# Patient Record
Sex: Female | Born: 1995 | Race: Black or African American | Hispanic: No | Marital: Single | Smoking: Former smoker
Health system: Southern US, Community
[De-identification: ages and names within clinical notes are randomized; demographics above are authoritative.]

## PROBLEM LIST (undated history)

## (undated) DIAGNOSIS — J45909 Unspecified asthma, uncomplicated: Secondary | ICD-10-CM

## (undated) DIAGNOSIS — G43909 Migraine, unspecified, not intractable, without status migrainosus: Secondary | ICD-10-CM

## (undated) DIAGNOSIS — I10 Essential (primary) hypertension: Secondary | ICD-10-CM

## (undated) DIAGNOSIS — M5430 Sciatica, unspecified side: Secondary | ICD-10-CM

## (undated) DIAGNOSIS — R011 Cardiac murmur, unspecified: Secondary | ICD-10-CM

## (undated) DIAGNOSIS — R55 Syncope and collapse: Secondary | ICD-10-CM

## (undated) DIAGNOSIS — R000 Tachycardia, unspecified: Secondary | ICD-10-CM

## (undated) DIAGNOSIS — E669 Obesity, unspecified: Secondary | ICD-10-CM

## (undated) HISTORY — PX: KELOID EXCISION: SHX1856

---
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
## 2014-03-25 DIAGNOSIS — Z309 Encounter for contraceptive management, unspecified: Secondary | ICD-10-CM | POA: Insufficient documentation

## 2014-04-14 ENCOUNTER — Observation Stay: Payer: Self-pay | Admitting: Specialist

## 2014-04-14 LAB — BASIC METABOLIC PANEL
Anion Gap: 11 (ref 7–16)
BUN: 10 mg/dL (ref 7–18)
CHLORIDE: 108 mmol/L — AB (ref 98–107)
Calcium, Total: 9.1 mg/dL (ref 9.0–10.7)
Co2: 21 mmol/L (ref 21–32)
Creatinine: 1.09 mg/dL (ref 0.60–1.30)
EGFR (African American): >60
Glucose: 102 mg/dL — ABNORMAL HIGH (ref 65–99)
OSMOLALITY: 279 (ref 275–301)
POTASSIUM: 3.9 mmol/L (ref 3.5–5.1)
SODIUM: 140 mmol/L (ref 136–145)

## 2014-04-14 LAB — URINALYSIS, COMPLETE
Bilirubin,UR: NEGATIVE
Blood: NEGATIVE
Glucose,UR: NEGATIVE mg/dL (ref 0–75)
KETONE: NEGATIVE
LEUKOCYTE ESTERASE: NEGATIVE
Nitrite: NEGATIVE
Ph: 6 (ref 4.5–8.0)
Protein: NEGATIVE
SPECIFIC GRAVITY: 1.024 (ref 1.003–1.030)

## 2014-04-14 LAB — CBC WITH DIFFERENTIAL/PLATELET
Basophil #: 0 10*3/uL (ref 0.0–0.1)
Basophil %: 0.7 %
Eosinophil #: 0.2 10*3/uL (ref 0.0–0.7)
Eosinophil %: 2.9 %
HCT: 40.4 % (ref 35.0–47.0)
HGB: 13 g/dL (ref 12.0–16.0)
Lymphocyte #: 2.3 10*3/uL (ref 1.0–3.6)
Lymphocyte %: 43.9 %
MCH: 26.5 pg (ref 26.0–34.0)
MCHC: 32.3 g/dL (ref 32.0–36.0)
MCV: 82 fL (ref 80–100)
MONO ABS: 0.5 x10 3/mm (ref 0.2–0.9)
Monocyte %: 9.4 %
NEUTROS ABS: 2.3 10*3/uL (ref 1.4–6.5)
NEUTROS PCT: 43.1 %
PLATELETS: 297 10*3/uL (ref 150–440)
RBC: 4.93 10*6/uL (ref 3.80–5.20)
RDW: 14.2 % (ref 11.5–14.5)
WBC: 5.3 10*3/uL (ref 3.6–11.0)

## 2014-04-14 LAB — DRUG SCREEN, URINE

## 2014-04-14 LAB — TROPONIN I: Troponin-I: <0.02 ng/mL

## 2014-04-14 LAB — CK-MB
CK-MB: 0.5 ng/mL (ref 0.5–3.6)
CK-MB: <0.5 ng/mL — ABNORMAL LOW (ref 0.5–3.6)

## 2014-04-15 ENCOUNTER — Ambulatory Visit: Payer: Self-pay | Admitting: Neurology

## 2014-04-15 LAB — CK-MB: CK-MB: <0.5 ng/mL — ABNORMAL LOW (ref 0.5–3.6)

## 2014-04-15 LAB — CBC WITH DIFFERENTIAL/PLATELET
Basophil #: 0 10*3/uL (ref 0.0–0.1)
Basophil %: 0.4 %
EOS ABS: 0.2 10*3/uL (ref 0.0–0.7)
EOS PCT: 3.5 %
HCT: 38.2 % (ref 35.0–47.0)
HGB: 12.7 g/dL (ref 12.0–16.0)
LYMPHS ABS: 3 10*3/uL (ref 1.0–3.6)
Lymphocyte %: 47.6 %
MCH: 26.9 pg (ref 26.0–34.0)
MCHC: 33.2 g/dL (ref 32.0–36.0)
MCV: 81 fL (ref 80–100)
MONO ABS: 0.5 x10 3/mm (ref 0.2–0.9)
MONOS PCT: 8.3 %
Neutrophil #: 2.5 10*3/uL (ref 1.4–6.5)
Neutrophil %: 40.2 %
PLATELETS: 260 10*3/uL (ref 150–440)
RBC: 4.71 10*6/uL (ref 3.80–5.20)
RDW: 14.6 % — AB (ref 11.5–14.5)
WBC: 6.3 10*3/uL (ref 3.6–11.0)

## 2014-04-15 LAB — BASIC METABOLIC PANEL
Anion Gap: 9 (ref 7–16)
BUN: 10 mg/dL (ref 7–18)
CO2: 23 mmol/L (ref 21–32)
Calcium, Total: 8.8 mg/dL — ABNORMAL LOW (ref 9.0–10.7)
Chloride: 108 mmol/L — ABNORMAL HIGH (ref 98–107)
Creatinine: 0.94 mg/dL (ref 0.60–1.30)
EGFR (Non-African Amer.): >60
Glucose: 90 mg/dL (ref 65–99)
OSMOLALITY: 278 (ref 275–301)
Potassium: 3.6 mmol/L (ref 3.5–5.1)
SODIUM: 140 mmol/L (ref 136–145)

## 2014-04-15 LAB — LIPID PANEL
Cholesterol: 235 mg/dL — ABNORMAL HIGH (ref 0–200)
HDL Cholesterol: 27 mg/dL — ABNORMAL LOW (ref 40–60)
LDL CHOLESTEROL, CALC: 181 mg/dL — AB (ref 0–100)
Triglycerides: 134 mg/dL (ref 0–135)
VLDL Cholesterol, Calc: 27 mg/dL (ref 5–40)

## 2014-04-15 LAB — TROPONIN I

## 2014-04-15 LAB — TSH: THYROID STIMULATING HORM: 3.64 u[IU]/mL

## 2014-04-17 ENCOUNTER — Inpatient Hospital Stay: Payer: Self-pay | Admitting: Internal Medicine

## 2014-04-17 LAB — COMPREHENSIVE METABOLIC PANEL
ALBUMIN: 3.8 g/dL (ref 3.8–5.6)
ALK PHOS: 76 U/L
ANION GAP: 6 — AB (ref 7–16)
BUN: 7 mg/dL (ref 7–18)
Bilirubin,Total: 0.3 mg/dL (ref 0.2–1.0)
CO2: 25 mmol/L (ref 21–32)
Calcium, Total: 9.7 mg/dL (ref 9.0–10.7)
Chloride: 104 mmol/L (ref 98–107)
Creatinine: 0.93 mg/dL (ref 0.60–1.30)
EGFR (African American): >60
Glucose: 93 mg/dL (ref 65–99)
Osmolality: 268 (ref 275–301)
Potassium: 4 mmol/L (ref 3.5–5.1)
SGOT(AST): 29 U/L — ABNORMAL HIGH (ref 0–26)
SGPT (ALT): 44 U/L
SODIUM: 135 mmol/L — AB (ref 136–145)
Total Protein: 7.5 g/dL (ref 6.4–8.6)

## 2014-04-17 LAB — URINALYSIS, COMPLETE
BILIRUBIN, UR: NEGATIVE
BLOOD: NEGATIVE
Glucose,UR: 50 mg/dL (ref 0–75)
Ketone: NEGATIVE
LEUKOCYTE ESTERASE: NEGATIVE
Nitrite: NEGATIVE
Ph: 7 (ref 4.5–8.0)
Protein: NEGATIVE
Specific Gravity: 1.013 (ref 1.003–1.030)

## 2014-04-17 LAB — CBC WITH DIFFERENTIAL/PLATELET
BASOS PCT: 0.5 %
Basophil #: 0 10*3/uL (ref 0.0–0.1)
EOS ABS: 0.3 10*3/uL (ref 0.0–0.7)
EOS PCT: 2.5 %
HCT: 40.6 % (ref 35.0–47.0)
HGB: 13.4 g/dL (ref 12.0–16.0)
LYMPHS PCT: 12 %
Lymphocyte #: 1.3 10*3/uL (ref 1.0–3.6)
MCH: 27 pg (ref 26.0–34.0)
MCHC: 33.1 g/dL (ref 32.0–36.0)
MCV: 82 fL (ref 80–100)
Monocyte #: 0.6 x10 3/mm (ref 0.2–0.9)
Monocyte %: 5.6 %
NEUTROS ABS: 8.5 10*3/uL — AB (ref 1.4–6.5)
Neutrophil %: 79.4 %
PLATELETS: 276 10*3/uL (ref 150–440)
RBC: 4.96 10*6/uL (ref 3.80–5.20)
RDW: 14.4 % (ref 11.5–14.5)
WBC: 10.7 10*3/uL (ref 3.6–11.0)

## 2014-04-18 LAB — CBC WITH DIFFERENTIAL/PLATELET
BASOS ABS: 0 10*3/uL (ref 0.0–0.1)
Basophil %: 0.4 %
EOS ABS: 0.4 10*3/uL (ref 0.0–0.7)
EOS PCT: 3 %
HCT: 36.6 % (ref 35.0–47.0)
HGB: 12.2 g/dL (ref 12.0–16.0)
Lymphocyte #: 2.3 10*3/uL (ref 1.0–3.6)
Lymphocyte %: 19.5 %
MCH: 27 pg (ref 26.0–34.0)
MCHC: 33.3 g/dL (ref 32.0–36.0)
MCV: 81 fL (ref 80–100)
Monocyte #: 0.9 x10 3/mm (ref 0.2–0.9)
Monocyte %: 7.3 %
NEUTROS PCT: 69.8 %
Neutrophil #: 8.2 10*3/uL — ABNORMAL HIGH (ref 1.4–6.5)
PLATELETS: 260 10*3/uL (ref 150–440)
RBC: 4.5 10*6/uL (ref 3.80–5.20)
RDW: 14.1 % (ref 11.5–14.5)
WBC: 11.8 10*3/uL — ABNORMAL HIGH (ref 3.6–11.0)

## 2014-04-18 LAB — BASIC METABOLIC PANEL
Anion Gap: 7 (ref 7–16)
BUN: 6 mg/dL — ABNORMAL LOW (ref 7–18)
CREATININE: 0.99 mg/dL (ref 0.60–1.30)
Calcium, Total: 8.3 mg/dL — ABNORMAL LOW (ref 9.0–10.7)
Chloride: 107 mmol/L (ref 98–107)
Co2: 25 mmol/L (ref 21–32)
EGFR (African American): >60
EGFR (Non-African Amer.): >60
Glucose: 106 mg/dL — ABNORMAL HIGH (ref 65–99)
Osmolality: 276 (ref 275–301)
Potassium: 3.6 mmol/L (ref 3.5–5.1)
Sodium: 139 mmol/L (ref 136–145)

## 2014-04-19 LAB — DRUG SCREEN, URINE

## 2014-04-20 DIAGNOSIS — R569 Unspecified convulsions: Secondary | ICD-10-CM | POA: Insufficient documentation

## 2014-04-25 ENCOUNTER — Emergency Department: Payer: Self-pay | Admitting: Emergency Medicine

## 2014-04-25 LAB — COMPREHENSIVE METABOLIC PANEL
ALBUMIN: 3.4 g/dL — AB (ref 3.8–5.6)
ALT: 42 U/L
ANION GAP: 6 — AB (ref 7–16)
AST: 28 U/L — AB (ref 0–26)
Alkaline Phosphatase: 66 U/L
BUN: 7 mg/dL (ref 7–18)
Bilirubin,Total: 0.2 mg/dL (ref 0.2–1.0)
CO2: 24 mmol/L (ref 21–32)
CREATININE: 0.95 mg/dL (ref 0.60–1.30)
Calcium, Total: 8.4 mg/dL — ABNORMAL LOW (ref 9.0–10.7)
Chloride: 111 mmol/L — ABNORMAL HIGH (ref 98–107)
EGFR (African American): >60
Glucose: 106 mg/dL — ABNORMAL HIGH (ref 65–99)
OSMOLALITY: 280 (ref 275–301)
Potassium: 3.6 mmol/L (ref 3.5–5.1)
SODIUM: 141 mmol/L (ref 136–145)
TOTAL PROTEIN: 7 g/dL (ref 6.4–8.6)

## 2014-04-25 LAB — CBC
HCT: 37.7 % (ref 35.0–47.0)
HGB: 12.1 g/dL (ref 12.0–16.0)
MCH: 26.1 pg (ref 26.0–34.0)
MCHC: 32.2 g/dL (ref 32.0–36.0)
MCV: 81 fL (ref 80–100)
Platelet: 346 10*3/uL (ref 150–440)
RBC: 4.64 10*6/uL (ref 3.80–5.20)
RDW: 13.9 % (ref 11.5–14.5)
WBC: 6.1 10*3/uL (ref 3.6–11.0)

## 2014-05-03 ENCOUNTER — Emergency Department: Payer: Self-pay | Admitting: Internal Medicine

## 2014-05-03 LAB — COMPREHENSIVE METABOLIC PANEL
ALBUMIN: 3.6 g/dL — AB (ref 3.8–5.6)
ALK PHOS: 63 U/L
ANION GAP: 7 (ref 7–16)
AST: 43 U/L — AB (ref 0–26)
BUN: 8 mg/dL (ref 7–18)
Bilirubin,Total: 0.3 mg/dL (ref 0.2–1.0)
CALCIUM: 8.5 mg/dL — AB (ref 9.0–10.7)
CO2: 25 mmol/L (ref 21–32)
CREATININE: 0.94 mg/dL (ref 0.60–1.30)
Chloride: 107 mmol/L (ref 98–107)
EGFR (African American): >60
EGFR (Non-African Amer.): >60
Glucose: 90 mg/dL (ref 65–99)
OSMOLALITY: 275 (ref 275–301)
Potassium: 3.7 mmol/L (ref 3.5–5.1)
SGPT (ALT): 53 U/L
Sodium: 139 mmol/L (ref 136–145)
Total Protein: 7.2 g/dL (ref 6.4–8.6)

## 2014-05-03 LAB — CK TOTAL AND CKMB (NOT AT ARMC): CK, Total: 148 U/L

## 2014-05-03 LAB — TROPONIN I: Troponin-I: <0.02 ng/mL

## 2014-05-03 LAB — CBC
HCT: 38.8 % (ref 35.0–47.0)
HGB: 12.7 g/dL (ref 12.0–16.0)
MCH: 26.7 pg (ref 26.0–34.0)
MCHC: 32.8 g/dL (ref 32.0–36.0)
MCV: 81 fL (ref 80–100)
Platelet: 304 10*3/uL (ref 150–440)
RBC: 4.78 10*6/uL (ref 3.80–5.20)
RDW: 14.1 % (ref 11.5–14.5)
WBC: 5.4 10*3/uL (ref 3.6–11.0)

## 2014-07-19 ENCOUNTER — Emergency Department: Payer: Self-pay | Admitting: Emergency Medicine

## 2014-07-19 LAB — URINALYSIS, COMPLETE
BACTERIA: NONE SEEN
BILIRUBIN, UR: NEGATIVE
Blood: NEGATIVE
GLUCOSE, UR: NEGATIVE mg/dL (ref 0–75)
Ketone: NEGATIVE
Leukocyte Esterase: NEGATIVE
NITRITE: NEGATIVE
PH: 7 (ref 4.5–8.0)
Protein: NEGATIVE
RBC,UR: <1 /HPF (ref 0–5)
Specific Gravity: 1.014 (ref 1.003–1.030)
Squamous Epithelial: 7

## 2014-07-19 LAB — CK TOTAL AND CKMB (NOT AT ARMC)
CK, Total: 141 U/L (ref 26–192)
CK-MB: 0.7 ng/mL (ref 0.5–3.6)

## 2014-07-19 LAB — TROPONIN I: Troponin-I: <0.02 ng/mL

## 2014-07-19 LAB — COMPREHENSIVE METABOLIC PANEL
ALK PHOS: 82 U/L
ALT: 30 U/L
Albumin: 3.3 g/dL — ABNORMAL LOW (ref 3.8–5.6)
Anion Gap: 7 (ref 7–16)
BUN: 7 mg/dL (ref 7–18)
Bilirubin,Total: 0.2 mg/dL (ref 0.2–1.0)
CHLORIDE: 108 mmol/L — AB (ref 98–107)
CO2: 27 mmol/L (ref 21–32)
Calcium, Total: 8.1 mg/dL — ABNORMAL LOW (ref 9.0–10.7)
Creatinine: 0.91 mg/dL (ref 0.60–1.30)
GLUCOSE: 108 mg/dL — AB (ref 65–99)
Osmolality: 282 (ref 275–301)
Potassium: 4 mmol/L (ref 3.5–5.1)
SGOT(AST): 32 U/L — ABNORMAL HIGH (ref 0–26)
Sodium: 142 mmol/L (ref 136–145)
Total Protein: 6.8 g/dL (ref 6.4–8.6)

## 2014-07-19 LAB — CBC
HCT: 38.1 % (ref 35.0–47.0)
HGB: 12.2 g/dL (ref 12.0–16.0)
MCH: 26.5 pg (ref 26.0–34.0)
MCHC: 32.1 g/dL (ref 32.0–36.0)
MCV: 83 fL (ref 80–100)
PLATELETS: 293 10*3/uL (ref 150–440)
RBC: 4.62 10*6/uL (ref 3.80–5.20)
RDW: 14 % (ref 11.5–14.5)
WBC: 7.5 10*3/uL (ref 3.6–11.0)

## 2014-07-20 ENCOUNTER — Emergency Department: Payer: Self-pay | Admitting: Student

## 2014-07-20 LAB — CK TOTAL AND CKMB (NOT AT ARMC)
CK, TOTAL: 137 U/L (ref 26–192)
CK-MB: 0.6 ng/mL (ref 0.5–3.6)

## 2014-07-20 LAB — DRUG SCREEN, URINE
Amphetamines, Ur Screen: NEGATIVE (ref ?–1000)
BENZODIAZEPINE, UR SCRN: NEGATIVE (ref ?–200)
Barbiturates, Ur Screen: NEGATIVE (ref ?–200)
Cannabinoid 50 Ng, Ur ~~LOC~~: NEGATIVE (ref ?–50)
Cocaine Metabolite,Ur ~~LOC~~: NEGATIVE (ref ?–300)
MDMA (ECSTASY) UR SCREEN: NEGATIVE (ref ?–500)
METHADONE, UR SCREEN: NEGATIVE (ref ?–300)
Opiate, Ur Screen: NEGATIVE (ref ?–300)
Phencyclidine (PCP) Ur S: NEGATIVE (ref ?–25)
Tricyclic, Ur Screen: NEGATIVE (ref ?–1000)

## 2014-07-20 LAB — CBC
HCT: 38.7 % (ref 35.0–47.0)
HGB: 12.3 g/dL (ref 12.0–16.0)
MCH: 26 pg (ref 26.0–34.0)
MCHC: 31.7 g/dL — ABNORMAL LOW (ref 32.0–36.0)
MCV: 82 fL (ref 80–100)
PLATELETS: 277 10*3/uL (ref 150–440)
RBC: 4.72 10*6/uL (ref 3.80–5.20)
RDW: 13.9 % (ref 11.5–14.5)
WBC: 6.6 10*3/uL (ref 3.6–11.0)

## 2014-07-20 LAB — COMPREHENSIVE METABOLIC PANEL
AST: 24 U/L (ref 0–26)
Albumin: 3.4 g/dL — ABNORMAL LOW (ref 3.8–5.6)
Alkaline Phosphatase: 70 U/L
Anion Gap: 8 (ref 7–16)
BILIRUBIN TOTAL: 0.3 mg/dL (ref 0.2–1.0)
BUN: 8 mg/dL (ref 7–18)
CO2: 25 mmol/L (ref 21–32)
CREATININE: 1.04 mg/dL (ref 0.60–1.30)
Calcium, Total: 8.7 mg/dL — ABNORMAL LOW (ref 9.0–10.7)
Chloride: 108 mmol/L — ABNORMAL HIGH (ref 98–107)
EGFR (African American): >60
EGFR (Non-African Amer.): >60
GLUCOSE: 93 mg/dL (ref 65–99)
OSMOLALITY: 279 (ref 275–301)
Potassium: 3.6 mmol/L (ref 3.5–5.1)
SGPT (ALT): 28 U/L
Sodium: 141 mmol/L (ref 136–145)
Total Protein: 7 g/dL (ref 6.4–8.6)

## 2014-07-20 LAB — URINALYSIS, COMPLETE
BACTERIA: NONE SEEN
BILIRUBIN, UR: NEGATIVE
Blood: NEGATIVE
GLUCOSE, UR: NEGATIVE mg/dL (ref 0–75)
Hyaline Cast: 3
Ketone: NEGATIVE
Leukocyte Esterase: NEGATIVE
Nitrite: NEGATIVE
PROTEIN: NEGATIVE
Ph: 7 (ref 4.5–8.0)
RBC,UR: <1 /HPF (ref 0–5)
SPECIFIC GRAVITY: 1.015 (ref 1.003–1.030)
Squamous Epithelial: 14

## 2014-07-20 LAB — HCG, QUANTITATIVE, PREGNANCY

## 2014-07-20 LAB — TROPONIN I

## 2014-08-24 DIAGNOSIS — R079 Chest pain, unspecified: Secondary | ICD-10-CM | POA: Insufficient documentation

## 2014-08-24 DIAGNOSIS — F445 Conversion disorder with seizures or convulsions: Secondary | ICD-10-CM | POA: Insufficient documentation

## 2014-08-24 DIAGNOSIS — R55 Syncope and collapse: Secondary | ICD-10-CM | POA: Insufficient documentation

## 2014-08-24 DIAGNOSIS — G43019 Migraine without aura, intractable, without status migrainosus: Secondary | ICD-10-CM | POA: Insufficient documentation

## 2014-08-24 HISTORY — DX: Conversion disorder with seizures or convulsions: F44.5

## 2014-11-04 ENCOUNTER — Emergency Department: Admit: 2014-11-04 | Disposition: A | Discharge status: Home / Self Care | Payer: Self-pay | Admitting: Student

## 2014-11-04 LAB — DRUG SCREEN, URINE
Amphetamines, Ur Screen: NEGATIVE
Barbiturates, Ur Screen: NEGATIVE
Benzodiazepine, Ur Scrn: NEGATIVE
CANNABINOID 50 NG, UR ~~LOC~~: NEGATIVE
COCAINE METABOLITE, UR ~~LOC~~: NEGATIVE
MDMA (Ecstasy)Ur Screen: NEGATIVE
Methadone, Ur Screen: NEGATIVE
OPIATE, UR SCREEN: NEGATIVE
PHENCYCLIDINE (PCP) UR S: NEGATIVE
Tricyclic, Ur Screen: NEGATIVE

## 2014-11-04 LAB — URINALYSIS, COMPLETE
BILIRUBIN, UR: NEGATIVE
Blood: NEGATIVE
Glucose,UR: NEGATIVE mg/dL (ref 0–75)
Ketone: NEGATIVE
Nitrite: NEGATIVE
Ph: 6 (ref 4.5–8.0)
Protein: NEGATIVE
Specific Gravity: 1.019 (ref 1.003–1.030)

## 2014-11-04 LAB — COMPREHENSIVE METABOLIC PANEL
ANION GAP: 7 (ref 7–16)
AST: 19 U/L
Albumin: 4 g/dL
Alkaline Phosphatase: 51 U/L
BUN: 8 mg/dL
Bilirubin,Total: <0.1 mg/dL — ABNORMAL LOW
Calcium, Total: 9 mg/dL
Chloride: 114 mmol/L — ABNORMAL HIGH
Co2: 22 mmol/L
Creatinine: 1.02 mg/dL — ABNORMAL HIGH
EGFR (African American): >60
EGFR (Non-African Amer.): >60
Glucose: 87 mg/dL
Potassium: 3.6 mmol/L
SGPT (ALT): 19 U/L
Sodium: 143 mmol/L
TOTAL PROTEIN: 7 g/dL

## 2014-11-04 LAB — CBC
HCT: 34.8 % — ABNORMAL LOW (ref 35.0–47.0)
HGB: 11.4 g/dL — AB (ref 12.0–16.0)
MCH: 25.9 pg — ABNORMAL LOW (ref 26.0–34.0)
MCHC: 32.8 g/dL (ref 32.0–36.0)
MCV: 79 fL — ABNORMAL LOW (ref 80–100)
Platelet: 260 10*3/uL (ref 150–440)
RBC: 4.41 10*6/uL (ref 3.80–5.20)
RDW: 15.3 % — ABNORMAL HIGH (ref 11.5–14.5)
WBC: 5.7 10*3/uL (ref 3.6–11.0)

## 2014-11-04 LAB — ETHANOL: Ethanol: <5 mg/dL

## 2014-11-04 LAB — HCG, QUANTITATIVE, PREGNANCY: Beta Hcg, Quant.: <1 m[IU]/mL

## 2014-11-21 NOTE — Discharge Summary (Signed)
PATIENT NAME:  Joann ApleyMOORE, [name] MR#:  [id number] DATE OF BIRTH:  03-Dec-1995  DATE OF ADMISSION:  04/17/2014 DATE OF DISCHARGE:  04/19/2014, [facility].   FINAL DIAGNOSES:  1.  Recurrent syncope, possible pseudoseizure versus seizure.  2.  Otitis media.  3.  Depression.   CURRENT MEDICATIONS INCLUDE:  Tylenol 650 mg orally every 4 hours as needed for pain, Colace 100 mg twice a day, Flonase nasal spray 2 sprays both nostrils daily, doxycycline 100 mg q. 12 hours, Keppra 1000 mg twice a day, Prozac 20 mg daily.   HOSPITAL COURSE: The patient was admitted 04/17/2014 with recurrent syncope, multiple syncopal episodes. She was recently in the hospital September 15 and discharged September 16, and now back September 18 with these syncopal episodes on a daily basis. Sometimes she passed out for longer periods of time, is confused afterwards and weak afterwards. No loss of bowel or bladder function, had trouble recognizing people. She was admitted to the hospital with seizure versus syncopal episode. She was seen in consultation by cardiology, Dr. Adrian BlackwaterShaukat Khan; by psychiatry, Dr. Margarita RanaSurya Challa; by neurology, Dr. Pauletta BrownsYuriy Zeylikman.    LABORATORY AND RADIOLOGICAL DATA DURING THIS AND THE LAST HOSPITAL COURSE:    LDL 181, HDL 27, triglycerides 134, TSH 3.64, prolactin 13.   The patient had a negative urine toxicology on September 15.   CT scan of the head negative.   Chest x-ray showed slight prominence of the main pulmonary artery, likely rotation.   Echocardiogram showed EF 55-60%, impaired relaxation, left atrium normal size and structure, moderately increased left ventricular posterior wall thickness. Small pericardial effusion, elevated mean left atrial pressure.   MRI of the brain with and without contrast had extensive artifact from dental braces. Examination of the brain appears normal.   ASSESSMENT AND PLAN:  1.  For the patient's recurrent syncope, this could be possible seizure. Telemetry  monitoring shows just sinus tachycardia episodes. Last hospitalization and this hospitalization all testing done here has been negative. Neurology and cardiology did see the patient. Neurology is not convinced that this is seizure but started Keppra and recommended video EEG monitoring, so I did speak with Dr. Lorenso CourierPowers [facility] to [facility] transfer for video EEG monitoring to figure out whether this is seizure or pseudoseizure. They did recommend a Holter and event monitor as an outpatient, which Dr. Welton FlakesKhan would be able to set up on Monday if out of the hospital by Monday. So far I do not have a reason for the patient's recurrent syncope.  2.  Otitis media, doxycycline p.o.  3.  Depression on Prozac.  4.  Obesity. BMI of 45.3. Weight loss is needed.   Time spent on patient care today: 60 minutes.    ____________________________ Herschell Dimesichard J. Renae GlossWieting, MD rjw:lt D: 04/19/2014 09:37:25 ET T: 04/19/2014 10:34:08 ET JOB#: [id number]  cc: Herschell Dimesichard J. Renae GlossWieting, MD, <Dictator> Salley ScarletICHARD J Brianny Soulliere MD ELECTRONICALLY SIGNED 04/23/2014 14:01

## 2014-11-21 NOTE — Consult Note (Signed)
PATIENT NAME:  Joann Miller, [name] MR#:  [id number] DATE OF BIRTH:  March 04, 1996  DATE OF CONSULTATION:  04/18/2014  CONSULTING PHYSICIAN:  Pauletta BrownsYuriy Derrion Tritz, MD  REASON FOR CONSULTATION: Syncope, seizure activity.   HISTORY OF PRESENT ILLNESS: This is a well-known patient to me, last seen a couple of days ago. A 19 year old female with multiple syncopal episodes in the past 10 days with increasing frequency with another syncopal/questionable seizure episode. This time, she had questionable loss of consciousness and confusion for about 30 minutes, now back to baseline. Information obtained from patient, patient's family who is currently at bedside.   PAST MEDICAL HISTORY: Syncope versus questionable seizure activity, hypertension, morbid obesity, asthma, cardiac murmur.   SOCIAL HISTORY: The patient lives at home with her grandmother.   FAMILY HISTORY: Noncontributory.   REVIEW OF SYSTEMS: No acute abnormalities found except history of syncope, history of depression, history of anxiety.   PHYSICAL EXAMINATION: VITAL SIGNS: Include a temperature of 98.8, pulse 108, respirations 19, blood pressure 138/74. Orthostatics are negative.  NEUROLOGIC: Awake, alert and oriented. Extraocular movements intact. No facial symmetry. Tongue is midline. Motor strength intact bilaterally, 5/5. Sensation intact to light touch and temperature. Coordination intact. Reflexes diminished. Gait not assessed.   IMPRESSION: A 19 year old female with multiple status seizure episodes, all differ in nature and duration. With the last one, there was a questionable loss of consciousness and the patient was confused for up to 30 minutes as per family. No tongue biting. No urinary incontinence. Currently back to baseline.   PLAN: Agree with obtaining MRI. The patient was loaded with Keppra 1500 mg. We will start on Keppra a gram every 12, as I cannot definitely rule out seizure activity at this point. We will follow up MRI. If the  patient is at baseline for the next day or so, would probably discharge her. Appreciate cardiology input. Agree with possibility of Holter outpatient monitor. If this occurs again, I think the patient needs admission to a bigger center where she needs video EEG monitoring for probably 2 or 3 days, which was explained to the patient's family. [facility]. Continue with Keppra a gram every 12 as an outpatient until further EEGs have been done. Meanwhile, the patient should follow up with [facility]. This case was discussed with the family, the grandparents and the father at bedside.   Thank you. It was a pleasure seeing this patient.     ____________________________ Pauletta BrownsYuriy Mako Pelfrey, MD yz:TT D: 04/18/2014 15:34:19 ET T: 04/18/2014 15:46:31 ET JOB#: [id number]  cc: Pauletta BrownsYuriy Wymon Swaney, MD, <Dictator> Pauletta BrownsYURIY Kamira Mellette MD ELECTRONICALLY SIGNED 05/04/2014 14:25

## 2014-11-21 NOTE — Consult Note (Signed)
PATIENT NAME:  Joann Miller, Joann Miller MR#:  [id number] DATE OF BIRTH:  08/23/1995  DATE OF CONSULTATION:  04/18/2014  REFERRING PHYSICIAN:   CONSULTING PHYSICIAN:  Laurier NancyShaukat A. Manvir Thorson, MD  INDICATION FOR CONSULTATION: Syncope repeatedly.   HISTORY OF PRESENT ILLNESS: This is a 19 year old African American female with a past medical history of hypertension, morbid obesity, asthma, who came into the hospital because of repeated episodes of passing out about 7 to 10 times in the last 10 days. She has been seen by neurology in the past and was recommended a Holter monitor by the neurologist as an outpatient. She had an echocardiogram done here while in the hospital also, which was unremarkable. She had some occasional atypical chest pain described as sharp pain in the chest intermittently, thus I was asked to evaluate the patient.   PAST MEDICAL HISTORY: As mentioned above.   ALLERGIES: PENICILLIN.   MEDICATIONS: Loratadine 10 mg once a day, fluticasone 5 mcg inhalation spray one spray per nostril once a day, fluoxetine 20 mg once a day.   SOCIAL HISTORY: She is an ex-smoker. No history of EtOH abuse or drug use.   FAMILY HISTORY: Positive for bipolar disorder.   PHYSICAL EXAMINATION: GENERAL: She is alert and oriented x 3, in no acute distress.  VITALS: Stable.  HEENT: No JVD.  LUNGS: Clear.  HEART: Regular rate and rhythm. Normal S1, S2. No audible murmur.  ABDOMEN: Soft, nontender, positive bowel sounds.  EXTREMITIES: No pedal edema.  NEUROLOGIC: The patient appears to be intact.   DIAGNOSTIC STUDIES: Her EKG shows normal sinus rhythm, sinus tachycardia, poor R wave progression, nonspecific ST-T changes.   LABORATORY DATA: BUN and creatinine when she came in was 10/1.09. Troponins x 2 were negative. Toxicology screen was negative. Her white count was 6.3, hemoglobin 12.7, and platelet count 260,000. She had a CT that was negative and echocardiogram was done which showed LVEF of 55% to 60%, mild  diastolic dysfunction, normal chamber size. There was trace mitral regurgitation and trace tricuspid regurgitation. Pulmonary artery pressures were normal.   ASSESSMENT AND PLAN: The patient had repeated episodes of syncope. EKG just showed sinus tachycardia initially when she first came. Right now she is not tachycardic. Cardiac enzymes were negative. Her chest pain appears to be atypical. We do not feel that she has any cardiac event going on. Her ejection fraction is normal, essentially just trace mitral regurgitation, trace tricuspid regurgitation. She ruled out for myocardial infarction. I advised getting outpatient Holter monitor. She was supposed to see us this Monday in the office. I advised discharging the patient and continue to follow up Monday in the office, and will put her on an event monitor or Holter monitor. Thank you very much for referral.     ____________________________ Laurier NancyShaukat A. Nuriyah Hanline, MD sak:at D: 04/18/2014 13:55:37 ET T: 04/18/2014 14:05:03 ET JOB#: [id number]  cc: Laurier NancyShaukat A. Melessa Cowell, MD, <Dictator> Laurier NancySHAUKAT A Iqra Rotundo MD ELECTRONICALLY SIGNED 05/07/2014 9:55

## 2014-11-21 NOTE — H&P (Signed)
PATIENT NAME:  Joann Miller, Joann Miller MR#:  [id number] DATE OF BIRTH:  1996-01-11  DATE OF ADMISSION:  04/14/2014  PRIMARY CARE PHYSICIAN: Nonlocal.   REFERRING PHYSICIAN: Toney RakesEryka Gayle, MD   CHIEF COMPLAINT: Syncope.   HISTORY OF PRESENT ILLNESS: The patient is a 19 year old African-American female with history of asthma and cardiac murmur, is presenting to the ED with a chief complaint of 6 episode of syncope for the past 10 days. Her mom works as a Engineer, civil (consulting)nurse and she is concerned about the patient's frequent syncopal episodes. The patient has chronic history of cardiac murmur, was evaluated by cardiology in the past and her entire workup was negative at that time. The patient has history of migraine headaches and she takes ibuprofen as needed basis. She is reporting that lately ibuprofen is not helping her much and her headaches are getting worse. She typically describes that each episode of syncope lasts for a few minutes and then soon after, she  spontaneously recovers, she gets headache and blurry vision. Before, syncope, she does not feel lightheaded, does not have any aura. Denies any chest pain or shortness of breath. Denies any palpitations. Denies any history of anxiety. Denies any recent stressors.  Mom and grandma were at bedside. No other complaints.   PAST MEDICAL HISTORY: Cardiac murmur, chronic asthma, migraine headaches, obesity.   PAST SURGICAL HISTORY: Keloid removal.   ALLERGIES: PENICILLIN.    PSYCHOSOCIAL HISTORY: Lives at home with grandmom, occasional smoking. No alcohol or illicit drug usage.   FAMILY HISTORY: Mother has history of bipolar disorder.   HOME MEDICATIONS: Ventolin 2 puff inhalation q. 4 times a day as needed basis,  multivitamin once daily, MiraLax orally once daily as needed for constipation, loratadine 10 mg p.o. once daily, hydrochlorothiazide 12.5 mg p.o. once daily, Advair 500 mcg 1 puff inhalation 2 times a day.   REVIEW OF SYSTEMS:   CONSTITUTIONAL: Denies any  fever, fatigue, weakness.  EYES: Complaining of blurry vision associated with a headache after syncopal episode.  ENT: Denies epistaxis, discharge, snoring, postnasal drip.  RESPIRATORY: Denies cough, COPD, has chronic history of asthma.  CARDIOVASCULAR: No chest pain or palpitations, but chronic history of cardiac murmur.  GASTROINTESTINAL: Denies nausea, vomiting, diarrhea, abdominal pain.  GENITOURINARY: No dysuria or hematuria.  GYNECOLOGIC AND BREASTS: Denies breast mass or vaginal discharge.  ENDOCRINE: Denies polyuria, nocturia, thyroid abnormalities.  The patient has obesity.  HEMATOLOGIC AND LYMPHATICS: No anemia, easy bruising or bleeding.  INTEGUMENTARY: No acne, rash, lesions.  MUSCULOSKELETAL: No joint pain in the neck and back. Denies gout.  NEUROLOGIC: Denies vertigo, ataxia, dementia; frequent syncopal episodes x6 in the past 10 days.   PSYCHIATRIC: Normal mood and effect  PHYSICAL EXAMINATION: VITAL SIGNS: Temperature 99.5, pulse 81, respirations 16 to 18, blood pressure 136/66, pulse oximetry 98% on room air.  GENERAL APPEARANCE: Not in any acute distress. Morbidly obese.  HEENT: Normocephalic, atraumatic. Pupils are equally reacting to light and accommodation. No scleral icterus. No conjunctival injection. No sinus tenderness. No postnasal drip. Moist mucous membranes.  NECK: Supple. No JVD. No thyromegaly. Range of motion is intact.  LUNGS: Clear to auscultation bilaterally. No accessory muscle use and no anterior chest wall tenderness on palpation.  CARDIAC: S1 and S2 normal. Regular rate and rhythm. Positive cardiac murmur, 3/6.   GASTROINTESTINAL: Soft. Bowel sounds are positive in all 4 quadrants, obese, nontender, nondistended. No hepatosplenomegaly. No masses felt.  NEUROLOGIC: Awake, alert, and oriented x3. Cranial nerves II through XII are grossly intact. Motor  and sensory are intact. Reflexes  2+.  EXTREMITIES: No edema. No cyanosis, no cerebellar signs.    IMAGING: CT head: No significant abnormalities. Chest x-ray, portable, slight prominence of the main pulmonary artery.  May partially be due to the right-sided rotation during the imaging. A 12-lead EKG: Normal sinus rhythm with 1 PVC.   LABORATORY DATA: Glucose is 102. Accu-Chek 105. The rest of the Chem-8 is normal. Troponin less than 0.02. CBC normal.  Urinalysis: Nitrites, leukocyte esterase is negative, ketones are negative. Blood is negative.   ASSESSMENT AND PLAN: A 19 year old, African-American, obese female presenting to the Emergency Department with the chief complaint of 6 episodes of syncope in the past 10 days.  Will be admitted with the following assessment and plan.  1. 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 Differential diagnosis can be cardiac arrhythmia, vasovagal, orthostatic. We will admit her to telemetry. Cycle cardiac biomarkers. We will obtain neurologic checks. We will obtain 2-D echocardiogram for wall motion abnormalities and ejection fraction. Neurology consultation is placed to Dr. Pauletta Browns. I will provide her Toradol as needed basis for pain management. The patient being obese, blood pressure being elevated, verapamil might be a good prophylactic medication for her migraine headaches. We will try to avoid triptans as that might prolong the other symptoms.   2. History of cardiac murmur. We will obtain echocardiogram.  3. History of asthma, currently stable. Continue her home medication, Advair, and we will provide Proventil as needed basis.  4. Obesity. The patient will be benefited with lifestyle modification. Currently, we will put her on low-fat and low-salt diet.  5. History of migraine headaches.  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  6. We will provide gastrointestinal and deep vein thrombosis prophylaxis.  7. She is full code.  Mom is the medical power of  attorney.  8. Nicotine dependence. The patient was provided with counseling for approximately 5 minutes.  We will provide a nicotine patch.   Plan of care was discussed in detail with the patient and her mom at bedside. They all verbalized understanding of the plan.   TOTAL TIME SPENT: Is 45 minutes. For nicotine cessation counseling, another 5 minutes was spent.     ____________________________ Ramonita Lab, MD ag:lr D: 04/14/2014 19:31:00 ET T: 04/14/2014 19:54:32 ET JOB#: [id number]  cc: Ramonita Lab, MD, <Dictator> Ramonita Lab MD ELECTRONICALLY SIGNED 04/21/2014 13:30

## 2014-11-21 NOTE — H&P (Signed)
PATIENT NAME:  Joann Miller, [name] MR#:  [id number] DATE OF BIRTH:  [birth year]  DATE OF ADMISSION:  04/17/2014  REFERRING EMERGENCY ROOM PHYSICIAN: Lurena Joinerebecca L. Lord, MD PRIMARY CARE PHYSICIAN: None.   CHIEF COMPLAINT: Multiple syncopal versus seizure episodes.   HISTORY OF PRESENT ILLNESS: This 19 year old female presents after a prolonged seizure/syncopal episode at home this morning. She was actually admitted on September 15 and discharged on September 16 for evaluation of the same. At that time, CT of the head was negative. Telemetry did not show any arrhythmia. A 2-D echocardiogram was normal. She was seen by neurology, Dr. Loretha BrasilZeylikman, who did not think that the patient had neurologic evidence of syncope. She was discharged with plan for Holter monitor and outpatient follow-up. The patient presents again today due to an episode this morning that occurred while she was seated at breakfast table. She was sitting down speaking her grandmother and said that she needed to use the restroom. Her grandmother told her to get up, but the patient responded that she was unable to stand. The grandmother got up to try to help her, but the patient slumped forward and her face went down onto the table. She then tried to stand and fell backwards onto the wall then onto the floor slowly and was unable to get up. The patient was unconscious for up to 30 minutes. She did not respond to stimuli when EMS presented. Even after the episode, she was confused and did not recognize several friends that were visiting. There was no tonic-clonic activity, no focal numbness or weakness, no slurred speech. The patient does complain of a headache before, during and after these episodes. The history is provided by the mother and grandmother as the patient is a poor historian.   PAST MEDICAL HISTORY:  1. Syncope versus seizure episodes starting around 04/14/2014.  2. Hypertension.  3. Morbid obesity.  4. Asthma.  5. Cardiac murmur.    PAST SURGICAL HISTORY: Keloid removal.   ALLERGIES: PENICILLIN CAUSES A RASH.   HOME MEDICATIONS:  1. Loratadine 10 mg 1 tablet daily.  2. Fluticasone 50 mcg inhalation nasal spray 1 spray each nostril once a day.  3. Fluoxetine 20 mg 1 tablet once a day   SOCIAL HISTORY: The patient lives at home with her grandmother. Her mother is present and participating in caregiving. She endorses occasional smoking. She does not drink alcohol or use illicit substances. She is not currently employed and is supposed to start classes in October. She had an Implanon removed several months ago. Not currently on any hormonal birth control.   FAMILY HISTORY: Mother has a history of bipolar disorder.   REVIEW OF SYSTEMS: The patient is unable to perform the review of systems due to altered mental status at the time of interview. The family denies any recent fevers, chills, nausea, vomiting. They do endorse decreased appetite. No cough, runny nose, shortness of breath. No urinary frequency or dysuria.   PHYSICAL EXAMINATION: VITAL SIGNS: Temperature 98.6, pulse 111, respirations 16, blood pressure 133/75, oxygenation 100% on room air.  GENERAL: No acute distress, lying comfortably in the examination bed.  HEENT: Pupils are equal, round, and reactive, conjunctivae are clear. Oral mucous membranes are pink and moist, braces are in place. There is no posterior pharyngeal exudate or erythema.  No cervical lymphadenopathy.  NECK: Obese. Trachea is midline. Thyroid is not palpable.  RESPIRATORY: On anterior examination, lungs are clear to auscultation bilaterally with good air movement. There is no coughing, wheezing or  shortness of breath.  CARDIOVASCULAR: Tachycardic, regular, no murmurs, rubs, or gallops, no peripheral edema. Peripheral pulses are 2+.  ABDOMEN: Soft, nontender, bowel sounds are present. There is no guarding, no rebound, no hepatosplenomegaly, no mass.  EXTREMITIES: No tender or swollen joints.  Range of motion of all joints is normal. She does not participate in provoked strength testing.  NEUROLOGIC: Cranial nerves II through XII are grossly intact. She moves all 4 extremities. She does not participate in the rest of the neurologic examination.  PSYCHIATRIC: The patient seems depressed. She is reluctant to talk. She seems somewhat confused and speaks in very simplistic terms, which her family reports is not normal for her.   LABORATORY DATA: CBC and CMP are pending at this time. Blood glucose is 91.   IMAGING: There is no imaging at this time.   ASSESSMENT AND PLAN: 1. Seizure versus syncopal activity: As noted above, she has recently been admitted for evaluation of the same and at the time testing including a 2-D echocardiogram, telemetry, EKG, CT of the head were all normal. She was seen by neurology at that time and no further testing was recommended from a neurologic standpoint. Plan was for a Holter monitor. At this point, we will admit to telemetry for monitoring for possible arrhythmia. Will order an EEG, though unfortunately it looks like this may not possible until Monday. This could be done as outpatient. Will consult neurology. Unfortunately, due to the patient's current inability to walk, she is not fit for discharge. I will not re-order 2-D echocardiogram or CT scan, as there are no focal deficits and recent testing was normal. Will hold off on MRI for now, again, as there are no focal deficits. Will defer to neurology for further testing. I do feel that she has some abnormal behavior, which may be related to stress and have ordered a psychiatric consultation as well.  2. Positive orthostatics: This may account for some of her symptoms. During our interview, I asked her to stand and in doing so, she became abruptly tachycardic, fell backwards and had a recurrence of her episode. Will start IV fluids at 125 mL/h. The family reports slightly decreased p.o. intake for the past several  days. Will also order urine cultures and a CBC to rule out infection.  3. Hypertension: The patient is not hypertensive at this time and is not on any home antihypertensive medications.  4. Depression: She is on fluoxetine at home. I have asked for psychiatric consultation. At this point, I am not continuing fluoxetine while inpatient.   TIME SPENT ON THIS ADMISSION: 40 minutes.     ____________________________ Ena Dawley. Clent Ridges, MD cpw:lm D: 04/17/2014 20:22:00 ET T: 04/17/2014 21:51:42 ET JOB#: [id number]  cc: Santina Evans P. Clent Ridges, MD, <Dictator> Gale Journey MD ELECTRONICALLY SIGNED 04/18/2014 12:37

## 2014-11-21 NOTE — Consult Note (Signed)
PATIENT NAME:  Joann Miller, Joann Miller MR#:  [id number] DATE OF BIRTH:  1995/10/01  DATE OF CONSULTATION:  04/18/2014  REFERRING PHYSICIAN:   CONSULTING PHYSICIAN:  Iktan Aikman K. Kempton Milne, MD  SUBJECTIVE: The patient was seen in consultation in room #160 [facility]. The patient is a 19 year old African-American female, not employed, not married, and lives with her maternal grandmother who is 20 years old. The patient and maternal grandmother live in a house. The patient plans to go back to school next month, that is October 2015, to study medical assistance. The [facility] to [facility] with a chief complaint of "having syncopal episodes since Labor Day 2015."  HISTORY OF PRESENT ILLNESS: The patient reports that she had about 9 syncopal episodes in a period of 7 days, which is a big concern to her. She did not hurt herself. She did not drink anything different. She did not eat anything different. She was doing her routine stuff and does not know the reason, and got concerned and was brought here for help.   PAST PSYCHIATRIC HISTORY: A history of suicide attempt on 1 occasion when she overdosed herself on prescription pills, which was Percocet, which was given to her when she hurt her ankle. Being followed in outpatient consultation and [place]. Does not remember the name of the agency, has an appointment coming up soon and relates with the counselors.   ALCOHOL AND DRUGS: Denies drinking alcohol. Does admit smoking THC occasionally. Denies any other street or prescription drug abuse.   MENTAL STATUS: The patient was seen in consultation in her room, along with her aunt. The patient alert and oriented, calm, pleasant, and cooperative. Fully aware of situation that brought her to [facility]. Affect is neutral, mood stable. Admits that she feels low and down and not really depressed; in fact, she was cheerful and smiling along with her aunt while talking about her problem. No psychosis. Does not  appear to be responding to internal stimuli. Memory is intact. Cognition is intact. Denies any ideas or plans to hurt herself. Although does feel low and down, stressed out at times, for which she is getting counseling, she has a concern about syncopal episodes and wanted to find the reason for the same. Insight and judgement fair and adequate.   IMPRESSION: History of major depression and being followed on an outpatient basis by a counselor and is on no antidepressant medications. Atypical seizure-like activity, syncopal seizure, syncopal ataxia, cause unknown. The patient has an appointment with cardiology and neurology for a detailed workup. She is medically cleared. She can be discharged with followup appointment with her counselors.    ____________________________ Jannet MantisSurya K. Guss Bundehalla, MD skc:at D: 04/18/2014 13:15:41 ET T: 04/18/2014 13:42:30 ET JOB#: [id number]  cc: Monika SalkSurya K. Guss Bundehalla, MD, <Dictator> Beau FannySURYA K Eddison Searls MD ELECTRONICALLY SIGNED 04/19/2014 11:00

## 2014-11-21 NOTE — Consult Note (Signed)
PATIENT NAME:  Joann Miller, Joann Miller MR#:  [id number] DATE OF BIRTH:  Dec 07, 1995  DATE OF CONSULTATION:  04/15/2014  CONSULTING PHYSICIAN:  Pauletta Browns, MD  REASON FOR CONSULTATION: Syncopal events.  HISTORY OF PRESENT ILLNESS:  This is a 19 year old African American female with a past medical history of asthma, cardiac murmur, unknown the type of cardiac murmur per patient and per mother at bedside, presents to Emergency Department with multiple syncopal episodes. The patient states she blacks out transiently.  She is aware of the situation; about 2 seconds prior to passing out, she feels "weird."  After the patient wakes up she is back to baseline. No tongue biting, no urinary incontinence. No seizure-like activity.  These syncopal-like events can happen in any position.  The patient can be either sitting or standing.  Post event at times, the patient has a headache and some blurred vision that self resolves.  The patient does complain almost daily of headaches, dull pressure-like self-relieved, that occur up to five days out of the week.  Occasionally relieved with over-the-counter ibuprofen.   PAST MEDICAL HISTORY:  Cardiac murmur, chronic asthma, migraine headaches, and obesity.   PAST SURGICAL HISTORY: Keloid removed.   ALLERGIES: PENICILLIN.   PSYCHOSOCIAL HISTORY: Lives at home with grandmom.   SOCIAL HISTORY:  Smoker. No alcohol, drug use.   HOME MEDICATION: Includes Ventolin, MiraLax, hydrochlorothiazide and Advair.   REVIEW OF SYSTEMS: Denies any fever or fatigue. Positive for generalized weakness. Complains of occasional blurry vision post syncopal episode. Denies any shortness of breath. Denies any chest pain. Denies any palpitations.  Denies any weakness on one side of the body compared to the other.  Denies any anxiety. Denies any depression.   IMAGING:  Reviewed.  CAT scan of the head did not show any acute intracranial abnormality. Laboratory work-up has been reviewed as well.     PHYSICAL EXAMINATION:  VITAL SIGNS: Include a temperature of 98.6, pulse 99, respirations 20, blood pressure 103/62, pulse oximetry 100% on room air.   NEUROLOGIC: The patient is alert, awake and oriented to time, place, location and the name of the hospital. Extraocular movements are intact. Pupils round, reactive bilaterally. Facial sensation intact. Facial motor is intact. Tongue is midline. Uvula elevates symmetrically. Shoulder shrug is intact. Motor shows strength 5/5 bilateral upper extremities and lower extremities.  Reflexes severely diminished, specifically in lower extremities, probably due to body habitus.  Finger-to-nose intact. Heel-to-shin intact as well.  Gait not assessed. Sensation intact to light touch and temperature.   IMPRESSION: A 19 year old female with multiple syncopal episodes with a recent history of cardiac work-up as per family that included echocardiogram.  I do not think these are seizure-like episodes.   PLAN: The patient is status post another echocardiogram. I would obtain orthostatics on her, strongly consider a Holter monitor as an outpatient if we do not find cause of syncopal events while she is currently in the hospital.  For headache control, start daily magnesium supplementation of 500 mg which is over-the-counter 1 tab a day as well as daily vitamin B complex, which is also over-the-counter medication. This was explained to patient and patient's mother who is at bedside.  Would not start her on any other daily prophylactic headache medication such as beta blockers or Topamax, which could worsen the syncopal events at this time.  No further recommendations at this point.  I do not think the patient needs an EEG or any other further imaging. Please call with any questions.  ____________________________ Pauletta BrownsYuriy Annamay Laymon, MD yz:DT D: 04/15/2014 13:08:01 ET T: 04/15/2014 13:19:49 ET JOB#: [id number]  cc: Pauletta BrownsYuriy Naser Schuld, MD, <Dictator> Pauletta BrownsYURIY Trever Streater  MD ELECTRONICALLY SIGNED 05/04/2014 14:23

## 2014-11-21 NOTE — Discharge Summary (Signed)
PATIENT NAME:  Joann Miller, [name] MR#:  [id number] DATE OF BIRTH:  08-20-1995  DATE OF ADMISSION:  04/17/2014 DATE OF DISCHARGE:  04/20/2014  ADDENDUM:   The patient was admitted 04/17/2014 and discharged 04/20/2014 to [facility], video monitoring under the care of Dr. Lorenso CourierPowers.   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 We did have EEG capability on 04/20/2014 which was a negative EEG.  The patient had 3 episodes of syncope on the afternoon and evening of 04/19/2014.  The [facility] for continuous EEG monitoring and video monitoring to rule out seizure disorder with the patient continuously having syncopal episodes.  Most likely this is pseudoseizure, but will need the confirmation while she has one of these episodes to determine that.  The [facility].     ____________________________ Herschell Dimesichard J. Renae GlossWieting, MD rjw:DT D: 04/20/2014 15:47:07 ET T: 04/20/2014 15:55:55 ET JOB#: [id number]  cc: Herschell Dimesichard J. Renae GlossWieting, MD, <Dictator> Marwan T. Powers, MD Salley ScarletICHARD J Tamerra Merkley MD ELECTRONICALLY SIGNED 04/23/2014 14:01

## 2014-11-21 NOTE — Discharge Summary (Signed)
PATIENT NAME:  Joann ApleyMOORE, [name] MR#:  [id number] DATE OF BIRTH:  12-21-1995  DATE OF ADMISSION:  04/14/2014 DATE OF DISCHARGE:  04/15/2014  For a detailed note, please take a look at the history and physical done on admission by Dr. Amado CoeGouru.  DISCHARGE DIAGNOSES:  1.  Syncope.  2.  Hypertension.  3.  Morbid obesity.  4.  Hyperlipidemia.   DISCHARGE DIET: The patient is being discharged on a low-sodium, low-fat diet.   DISCHARGE ACTIVITY: As tolerated.  DISCHARGE FOLLOWUP: Follow up with Dr. Adrian BlackwaterShaukat Khan in the next 1 to 2 weeks.  DISCHARGE MEDICATIONS: Multivitamin daily, Advair 500/50 one puff b.i.d., albuterol inhaler 2 puffs 4 times daily as needed, Senokot 2 tabs b.i.d., Peri-Colace 2 tabs b.i.d. as needed, MiraLax daily, HCTZ 12.5 mg daily, loratadine 10 mg daily.   PERTINENT STUDIES DONE DURING THE HOSPITAL COURSE: CT scan of the head done without contrast showed no acute intracranial process.   Chest x-ray done on admission showed slight prominence of the main pulmonary artery, otherwise negative exam.   A 2-dimensional echocardiogram showed an ejection fraction of 55% to 60%, impaired relaxation pattern with LV diastolic filling, moderately increased left ventricular posterior wall thickness.   HOSPITAL COURSE: This is a 19 year old female who presented to the hospital with recurrent syncopal episodes. 1.  Syncope. The exact etiology of syncope is still unclear, but thought to be mostly cardiogenic in nature. The patient was seen by neurology who did not think that the patient had neurogenic evidence of syncope. The patient had a CT of the head, which was negative. Neurology did not think these were seizures. They did not recommend a MRI or an EEG. She had orthostatic vital signs checked, which were also negative. She was observed on telemetry and had no evidence of acute arrhythmias. I think she would benefit from a loop monitor, which can be arranged through a cardiologist as an  outpatient.  2.  Hypertension. The patient did have labile blood pressures. She is currently being maintained on HCTZ. She will continue that. Further titrations to her antihypertensive meds can be done as an outpatient through her primary care physician. 3.  Hyperlipidemia. The patient's total cholesterol was 300. I discussed with her about starting a statin, but the patient and the family wanted to try lifestyle modifications prior to starting any therapy presently. They will try some fish oil tablets at this point.  4.  Asthma. The patient had no acute exacerbation. She will continue to Advair and as needed albuterol inhaler.   DISCHARGE PLANS: The patient is being set up an appointment with Dr. Welton FlakesKhan to get a loop monitor done in the next 1 to 2 days.   TIME SPENT ON DISCHARGE: 35 minutes.   ____________________________ Rolly PancakeVivek J. Cherlynn KaiserSainani, MD vjs:sb D: 04/16/2014 15:20:19 ET T: 04/16/2014 16:31:37 ET JOB#: [id number]  cc: Rolly PancakeVivek J. Cherlynn KaiserSainani, MD, <Dictator> Laurier NancyShaukat A. Khan, MD Houston SirenVIVEK J SAINANI MD ELECTRONICALLY SIGNED 04/20/2014 9:43

## 2015-04-07 DIAGNOSIS — F5104 Psychophysiologic insomnia: Secondary | ICD-10-CM | POA: Insufficient documentation

## 2015-05-03 ENCOUNTER — Encounter
Admission: RE | Admit: 2015-05-03 | Discharge: 2015-05-03 | Disposition: A | Discharge status: Home / Self Care | Payer: Medicaid Other | Source: Ambulatory Visit | Attending: Otolaryngology | Admitting: Otolaryngology

## 2015-05-03 DIAGNOSIS — Z01818 Encounter for other preprocedural examination: Secondary | ICD-10-CM | POA: Insufficient documentation

## 2015-05-03 DIAGNOSIS — T161XXA Foreign body in right ear, initial encounter: Secondary | ICD-10-CM | POA: Diagnosis not present

## 2015-05-03 DIAGNOSIS — R000 Tachycardia, unspecified: Secondary | ICD-10-CM | POA: Diagnosis not present

## 2015-05-03 HISTORY — DX: Migraine, unspecified, not intractable, without status migrainosus: G43.909

## 2015-05-03 HISTORY — DX: Unspecified asthma, uncomplicated: J45.909

## 2015-05-03 HISTORY — DX: Obesity, unspecified: E66.9

## 2015-05-03 HISTORY — DX: Tachycardia, unspecified: R00.0

## 2015-05-03 HISTORY — DX: Cardiac murmur, unspecified: R01.1

## 2015-05-03 HISTORY — DX: Syncope and collapse: R55

## 2015-05-03 HISTORY — DX: Sciatica, unspecified side: M54.30

## 2015-05-03 LAB — BASIC METABOLIC PANEL
Anion gap: 5 (ref 5–15)
BUN: 10 mg/dL (ref 6–20)
CHLORIDE: 108 mmol/L (ref 101–111)
CO2: 24 mmol/L (ref 22–32)
Calcium: 9 mg/dL (ref 8.9–10.3)
Creatinine, Ser: 0.92 mg/dL (ref 0.44–1.00)
GFR calc non Af Amer: >60 mL/min (ref >60)
Glucose, Bld: 95 mg/dL (ref 65–99)
POTASSIUM: 3.8 mmol/L (ref 3.5–5.1)
Sodium: 137 mmol/L (ref 135–145)

## 2015-05-03 NOTE — Patient Instructions (Signed)
  Your procedure is scheduled on: 05/10/15  MonReport to Day Surgery.2nd floor Medical Cleotis Lema To find out your arrival time please call [phone] between 1PM - 3PM on 05/07/15  Remember: Instructions that are not followed completely may result in serious medical risk, up to and including death, or upon the discretion of your surgeon and anesthesiologist your surgery may need to be rescheduled.    _x___ 1. Do not eat food or drink liquids after midnight. No gum chewing or hard candies.     ____ 2. No Alcohol for 24 hours before or after surgery.   ____ 3. Bring all medications with you on the day of surgery if instructed.    ____ 4. Notify your doctor if there is any change in your medical condition     (cold, fever, infections).     Do not wear jewelry, make-up, hairpins, clips or nail polish.  Do not wear lotions, powders, or perfumes. You may wear deodorant.  Do not shave 48 hours prior to surgery. Men may shave face and neck.  Do not bring valuables to the hospital.    [facility] is not responsible for any belongings or valuables.               Contacts, dentures or bridgework may not be worn into surgery.  Leave your suitcase in the car. After surgery it may be brought to your room.  For patients admitted to the hospital, discharge time is determined by your                treatment team.   Patients discharged the day of surgery will not be allowed to drive home.   Please read over the following fact sheets that you were given:      _x___ Take these medicines the morning of surgery with A SIP OF WATER:    1. verapamil (VERELAN PM) 120 MG 24 hr capsule   2.   3.   4.  5.  6.  ____ Fleet Enema (as directed)   ____ Use CHG Soap as directed  ____ Use inhalers on the day of surgery  ____ Stop metformin 2 days prior to surgery    ____ Take 1/2 of usual insulin dose the night before surgery and none on the morning of surgery.   ____ Stop Coumadin/Plavix/aspirin on    __x__ Stop Anti-inflammatories on stop naprosyn 1 week before   ____ Stop supplements until after surgery.    ____ Bring C-Pap to the hospital.

## 2015-05-10 ENCOUNTER — Ambulatory Visit: Payer: Medicaid Other | Admitting: Anesthesiology

## 2015-05-10 ENCOUNTER — Ambulatory Visit
Admission: RE | Admit: 2015-05-10 | Discharge: 2015-05-10 | Disposition: A | Discharge status: Home / Self Care | Payer: Medicaid Other | Source: Ambulatory Visit | Attending: Otolaryngology | Admitting: Otolaryngology

## 2015-05-10 ENCOUNTER — Encounter: Payer: Self-pay | Admitting: Anesthesiology

## 2015-05-10 ENCOUNTER — Encounter
Admission: RE | Disposition: A | Discharge status: Home / Self Care | Payer: Self-pay | Source: Ambulatory Visit | Attending: Otolaryngology

## 2015-05-10 DIAGNOSIS — Z79899 Other long term (current) drug therapy: Secondary | ICD-10-CM | POA: Diagnosis not present

## 2015-05-10 DIAGNOSIS — Z8261 Family history of arthritis: Secondary | ICD-10-CM | POA: Insufficient documentation

## 2015-05-10 DIAGNOSIS — L905 Scar conditions and fibrosis of skin: Secondary | ICD-10-CM | POA: Diagnosis not present

## 2015-05-10 DIAGNOSIS — Z880 Allergy status to penicillin: Secondary | ICD-10-CM | POA: Diagnosis not present

## 2015-05-10 DIAGNOSIS — Z833 Family history of diabetes mellitus: Secondary | ICD-10-CM | POA: Diagnosis not present

## 2015-05-10 DIAGNOSIS — Z818 Family history of other mental and behavioral disorders: Secondary | ICD-10-CM | POA: Insufficient documentation

## 2015-05-10 DIAGNOSIS — Z7951 Long term (current) use of inhaled steroids: Secondary | ICD-10-CM | POA: Insufficient documentation

## 2015-05-10 DIAGNOSIS — X58XXXA Exposure to other specified factors, initial encounter: Secondary | ICD-10-CM | POA: Diagnosis not present

## 2015-05-10 DIAGNOSIS — Z823 Family history of stroke: Secondary | ICD-10-CM | POA: Diagnosis not present

## 2015-05-10 DIAGNOSIS — Z811 Family history of alcohol abuse and dependence: Secondary | ICD-10-CM | POA: Diagnosis not present

## 2015-05-10 DIAGNOSIS — Z825 Family history of asthma and other chronic lower respiratory diseases: Secondary | ICD-10-CM | POA: Diagnosis not present

## 2015-05-10 DIAGNOSIS — L910 Hypertrophic scar: Secondary | ICD-10-CM | POA: Diagnosis present

## 2015-05-10 DIAGNOSIS — S00451A Superficial foreign body of right ear, initial encounter: Secondary | ICD-10-CM | POA: Diagnosis not present

## 2015-05-10 DIAGNOSIS — T161XXA Foreign body in right ear, initial encounter: Secondary | ICD-10-CM | POA: Diagnosis not present

## 2015-05-10 DIAGNOSIS — Z8249 Family history of ischemic heart disease and other diseases of the circulatory system: Secondary | ICD-10-CM | POA: Diagnosis not present

## 2015-05-10 HISTORY — PX: MINOR EXCISION EAR CANAL MASS: SHX6241

## 2015-05-10 HISTORY — PX: FOREIGN BODY REMOVAL EAR: SHX5321

## 2015-05-10 LAB — POCT PREGNANCY, URINE: PREG TEST UR: NEGATIVE

## 2015-05-10 SURGERY — REMOVAL, FOREIGN BODY, EAR
Anesthesia: General | Site: Ear | Laterality: Right | Wound class: Clean

## 2015-05-10 MED ORDER — BACITRACIN ZINC 500 UNIT/GM EX OINT
TOPICAL_OINTMENT | CUTANEOUS | Status: AC
  Filled 2015-05-10: qty 28.35

## 2015-05-10 MED ORDER — ALBUTEROL SULFATE (2.5 MG/3ML) 0.083% IN NEBU
2.5000 mg | INHALATION_SOLUTION | Freq: Once | RESPIRATORY_TRACT | Status: DC
  Filled 2015-05-10: qty 3

## 2015-05-10 MED ORDER — SUCCINYLCHOLINE CHLORIDE 20 MG/ML IJ SOLN
INTRAMUSCULAR | Status: DC | PRN
  Administered 2015-05-10: 200 mg via INTRAVENOUS

## 2015-05-10 MED ORDER — LIDOCAINE-EPINEPHRINE (PF) 1 %-1:200000 IJ SOLN
INTRAMUSCULAR | Status: AC
Start: 2015-05-10 — End: 2015-05-10
  Filled 2015-05-10: qty 30

## 2015-05-10 MED ORDER — LIDOCAINE-EPINEPHRINE (PF) 1 %-1:200000 IJ SOLN
INTRAMUSCULAR | Status: DC | PRN
  Administered 2015-05-10: 4 mL

## 2015-05-10 MED ORDER — ONDANSETRON HCL 4 MG/2ML IJ SOLN
INTRAMUSCULAR | Status: DC | PRN
  Administered 2015-05-10: 4 mg via INTRAVENOUS

## 2015-05-10 MED ORDER — CIPROFLOXACIN-DEXAMETHASONE 0.3-0.1 % OT SUSP
OTIC | Status: AC
  Filled 2015-05-10: qty 7.5

## 2015-05-10 MED ORDER — PROPOFOL 10 MG/ML IV BOLUS
INTRAVENOUS | Status: DC | PRN
  Administered 2015-05-10: 200 mg via INTRAVENOUS

## 2015-05-10 MED ORDER — FAMOTIDINE 20 MG PO TABS
ORAL_TABLET | ORAL | Status: AC
  Administered 2015-05-10: 20 mg via ORAL
  Filled 2015-05-10: qty 1

## 2015-05-10 MED ORDER — BACITRACIN 500 UNIT/GM EX OINT
TOPICAL_OINTMENT | CUTANEOUS | Status: DC | PRN
  Administered 2015-05-10: 1 via TOPICAL

## 2015-05-10 MED ORDER — ALBUTEROL SULFATE HFA 108 (90 BASE) MCG/ACT IN AERS
INHALATION_SPRAY | RESPIRATORY_TRACT | Status: DC | PRN
  Administered 2015-05-10: 6 via RESPIRATORY_TRACT

## 2015-05-10 MED ORDER — LIDOCAINE HCL (CARDIAC) 20 MG/ML IV SOLN
INTRAVENOUS | Status: DC | PRN
  Administered 2015-05-10: 100 mg via INTRAVENOUS

## 2015-05-10 MED ORDER — FENTANYL CITRATE (PF) 100 MCG/2ML IJ SOLN
INTRAMUSCULAR | Status: DC | PRN
  Administered 2015-05-10: 100 ug via INTRAVENOUS

## 2015-05-10 MED ORDER — FAMOTIDINE 20 MG PO TABS
20.0000 mg | ORAL_TABLET | Freq: Once | ORAL | Status: AC
  Administered 2015-05-10: 20 mg via ORAL

## 2015-05-10 MED ORDER — HYDROCODONE-ACETAMINOPHEN 7.5-325 MG PO TABS: 1.0000 | ORAL_TABLET | Freq: Once | ORAL | Status: DC | PRN

## 2015-05-10 MED ORDER — LACTATED RINGERS IV SOLN
INTRAVENOUS | Status: DC
  Administered 2015-05-10: 07:00:00 via INTRAVENOUS

## 2015-05-10 MED ORDER — DEXAMETHASONE SODIUM PHOSPHATE 4 MG/ML IJ SOLN
INTRAMUSCULAR | Status: DC | PRN
  Administered 2015-05-10: 4 mg via INTRAVENOUS

## 2015-05-10 MED ORDER — FENTANYL CITRATE (PF) 100 MCG/2ML IJ SOLN: 25.0000 ug | INTRAMUSCULAR | Status: DC | PRN

## 2015-05-10 SURGICAL SUPPLY — 23 items
BLADE SURG 15 STRL LF DISP TIS (BLADE) ×1 IMPLANT
BLADE SURG 15 STRL SS (BLADE) ×1
BLADE SURG SZ11 CARB STEEL (BLADE) IMPLANT
CANISTER SUCT 1200ML W/VALVE (MISCELLANEOUS) ×2 IMPLANT
DRSG GLASSCOCK MASTOID ADT (GAUZE/BANDAGES/DRESSINGS) IMPLANT
GLOVE EXAM LX STRL 7.5 (GLOVE) ×4 IMPLANT
GOWN STRL REUS W/ TWL LRG LVL3 (GOWN DISPOSABLE) ×2 IMPLANT
GOWN STRL REUS W/TWL LRG LVL3 (GOWN DISPOSABLE) ×2
KIT RM TURNOVER STRD PROC AR (KITS) ×2 IMPLANT
LABEL OR SOLS (LABEL) ×2 IMPLANT
NS IRRIG 500ML POUR BTL (IV SOLUTION) ×2 IMPLANT
PACK HEAD/NECK (MISCELLANEOUS) ×2 IMPLANT
PAD GROUND ADULT SPLIT (MISCELLANEOUS) ×2 IMPLANT
SUCTION FRAZIER TIP 10 FR DISP (SUCTIONS) ×2 IMPLANT
SUT ETHILON 5-0 (SUTURE) ×1
SUT ETHILON 5-0 C-3 18XMFL BLK (SUTURE) ×1
SUT VIC AB 4-0 FS2 27 (SUTURE) IMPLANT
SUT VICRYL 5-0 (SUTURE) ×2 IMPLANT
SUTURE ETHLN 5-0 C3 18XMF BLK (SUTURE) ×1 IMPLANT
SYR 3ML LL SCALE MARK (SYRINGE) ×2 IMPLANT
SYRINGE 10CC LL (SYRINGE) ×2 IMPLANT
TOWEL OR 17X26 4PK STRL BLUE (TOWEL DISPOSABLE) ×2 IMPLANT
TUBING CONNECTING 10 (TUBING) ×2 IMPLANT

## 2015-05-10 NOTE — Transfer of Care (Signed)
Immediate Anesthesia Transfer of Care Note  Patient: Joann Miller  Procedure(s) Performed: Procedure(s): REMOVAL FOREIGN BODY EAR (Right) MINOR EXCISION EAR CANAL MASS/EXCISION KELOID AND ADVANCEMENT FLAP REPAIR (Right)  Patient Location: PACU  Anesthesia Type:General  Level of Consciousness: awake  Airway & Oxygen Therapy: Patient Spontanous Breathing and Patient connected to face mask oxygen  Post-op Assessment: Report given to RN and Post -op Vital signs reviewed and stable  Post vital signs: stable  Last Vitals:  Filed Vitals:   05/10/15 0828  BP: 154/94  Pulse: 94  Temp: 36.6 C  Resp: 18    Complications: No apparent anesthesia complications

## 2015-05-10 NOTE — Anesthesia Preprocedure Evaluation (Addendum)
Anesthesia Evaluation  Patient identified by MRN, date of birth, ID band Patient awake    Reviewed: Allergy & Precautions, H&P , NPO status , Patient's Chart, lab work & pertinent test results  History of Anesthesia Complications Negative for: history of anesthetic complications  Airway Mallampati: II  TM Distance: >3 FB Neck ROM: full    Dental no notable dental hx. (+) Teeth Intact   Pulmonary neg shortness of breath, asthma , sleep apnea ,    Pulmonary exam normal breath sounds clear to auscultation       Cardiovascular Exercise Tolerance: Good (-) Past MI Normal cardiovascular exam Rhythm:regular Rate:Normal     Neuro/Psych  Headaches, Seizures -, Poorly Controlled,   Neuromuscular disease negative psych ROS   GI/Hepatic negative GI ROS, Neg liver ROS,   Endo/Other  Morbid obesity  Renal/GU negative Renal ROS  negative genitourinary   Musculoskeletal   Abdominal   Peds  Hematology negative hematology ROS (+)   Anesthesia Other Findings Past Medical History:   Asthma                                                       Murmur                                                       Migraine                                                     Sciatic leg pain                                             Super Morbid Obesity                                                      Syncope                                                      Tachycardia                                                    Reproductive/Obstetrics negative OB ROS                            Anesthesia Physical Anesthesia Plan  ASA: III  Anesthesia Plan: General ETT   Post-op Pain Management:    Induction:  Airway Management Planned:   Additional Equipment:   Intra-op Plan:   Post-operative Plan:   Informed Consent: I have reviewed the patients History and Physical, chart, labs and discussed the  procedure including the risks, benefits and alternatives for the proposed anesthesia with the patient or authorized representative who has indicated his/her understanding and acceptance.   Dental Advisory Given  Plan Discussed with: Anesthesiologist, CRNA and Surgeon  Anesthesia Plan Comments:         Anesthesia Quick Evaluation

## 2015-05-10 NOTE — Discharge Instructions (Signed)

## 2015-05-10 NOTE — Op Note (Signed)
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 There was a small keloid anteriorly and a large keloid posteriorly that was covering over the entire posterior post of the earring.   Procedure: The patient was brought to the operating room and given general anesthesia by oral endotracheal intubation the head was turned to the left to be able see the right ear better. This was prepped and draped in sterile fashion. 4 mL of 1% Xylocaine with epi 100,000 were used for infiltration the posterior auricular crease for vasoconstriction and numbing.  An incision was created around the posterior keloid and carried down to where it was attached to the post and surrounding the end of the post-. This was then completely removed. The post-was then taken off and the earring was removed. An incision was then created around the keloid on the anterior wall of the auricle in the bowl and removal of the anterior keloid and track that went down through the cartilage. This completely remove the keloid then from both sides.  The skin was elevated anteriorly and posteriorly and the posterior auricle to allow advancement closure of the skin. The keloid removal was 2-1/2 cm posteriorly from superior to inferior direction and about 2 cm from anterior to posterior. The perichondrium was left on the cartilage. 50 Vicryls was then used to help pull the dermis together to advanced the flaps and help close the wound. 2 interrupted sutures were used of 50 Vicryls. The skin edges were then held together with a running 5-0 nylon locking suture. Anteriorly the skin  was elevated around the anterior wound defect were the keloid was removed. This was then advanced middle and closed with 5-0 nylon sutures.  The wound was covered with the antibody ointment. The patient tolerated the procedure well. She was awakened taken to the recovery room in satisfactory condition  Dispo:   To PACU to then be discharged home  Plan:  She'll follow-up in the office in 1 week for suture removal.  to use an ointment over the wound.  Baya Lentz H  05/10/2015 8:12 AM

## 2015-05-10 NOTE — H&P (Signed)
  H&P has been reviewed and no changes necessary. To be downloaded later. 

## 2015-05-10 NOTE — Anesthesia Procedure Notes (Signed)
Procedure Name: Intubation Date/Time: 05/10/2015 7:28 AM Performed by: Irving Burton Pre-anesthesia Checklist: Patient identified, Emergency Drugs available, Suction available and Patient being monitored Patient Re-evaluated:Patient Re-evaluated prior to inductionOxygen Delivery Method: Circle system utilized Preoxygenation: Pre-oxygenation with 100% oxygen Intubation Type: IV induction Ventilation: Mask ventilation without difficulty Laryngoscope Size: Mac and 3 Grade View: Grade I Tube type: Oral Tube size: 7.0 mm Number of attempts: 1 Airway Equipment and Method: Patient positioned with wedge pillow and Stylet Placement Confirmation: ETT inserted through vocal cords under direct vision,  positive ETCO2 and breath sounds checked- equal and bilateral Secured at: 21 cm Tube secured with: Tape Dental Injury: Teeth and Oropharynx as per pre-operative assessment

## 2015-05-10 NOTE — Anesthesia Postprocedure Evaluation (Signed)
  Anesthesia Post-op Note  Patient: Joann Miller  Procedure(s) Performed: Procedure(s): REMOVAL FOREIGN BODY EAR (Right) MINOR EXCISION EAR CANAL MASS/EXCISION KELOID AND ADVANCEMENT FLAP REPAIR (Right)  Anesthesia type:General ETT  Patient location: PACU  Post pain: Pain level controlled  Post assessment: Post-op Vital signs reviewed, Patient's Cardiovascular Status Stable, Respiratory Function Stable, Patent Airway and No signs of Nausea or vomiting  Post vital signs: Reviewed and stable  Last Vitals:  Filed Vitals:   05/10/15 0945  BP: 108/68  Pulse: 82  Temp:   Resp:     Level of consciousness: awake, alert  and patient cooperative  Complications: No apparent anesthesia complications

## 2015-05-11 LAB — SURGICAL PATHOLOGY

## 2015-06-24 IMAGING — CR DG CHEST 2V
1 series · 2 of 2 positions shown · non-contrast
Comparison: 04/14/2014

CLINICAL DATA: hx of asthma, hypertention ,heart murmur (per
previous chart), Unable to verbally communicate any symptoms at this
time. Pt had sycopal episode today.

EXAM:
CHEST  2 VIEW

[Series 1: dxr chest pa (or ap) and lateral · 0.14mm/px · 2 of 2 slices shown]
[im 1/2]
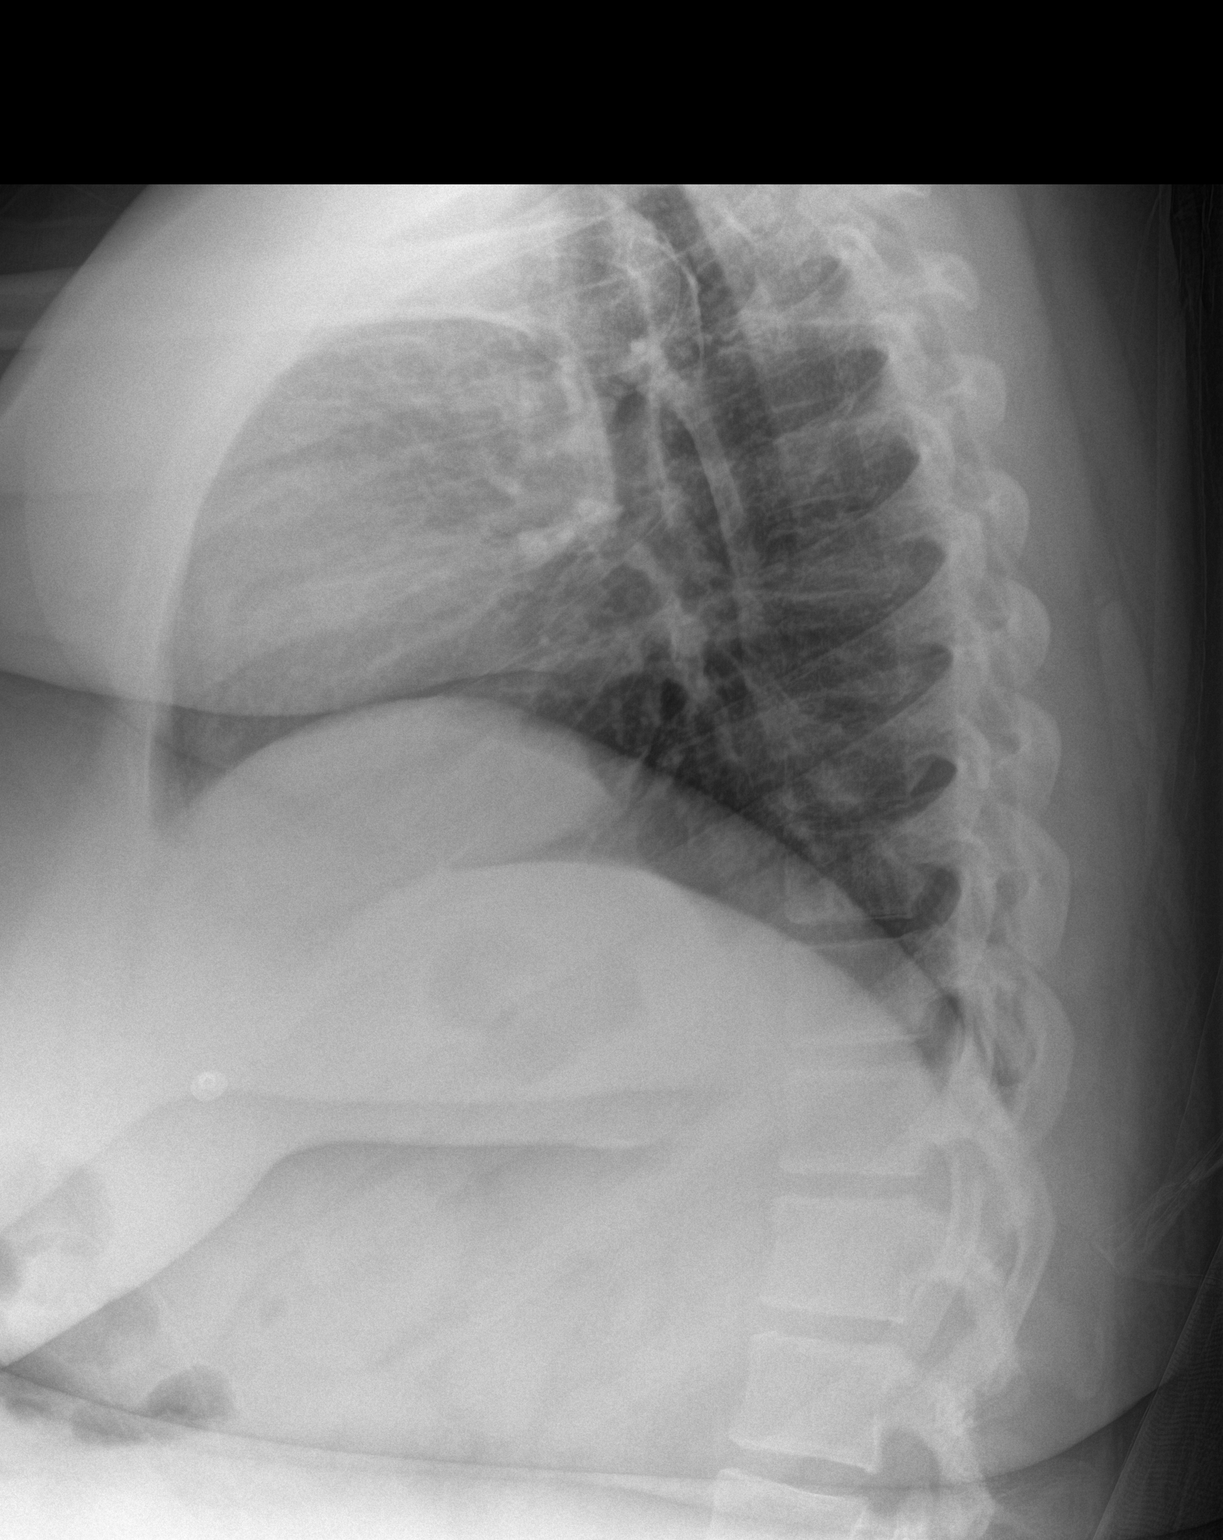
[im 2/2]
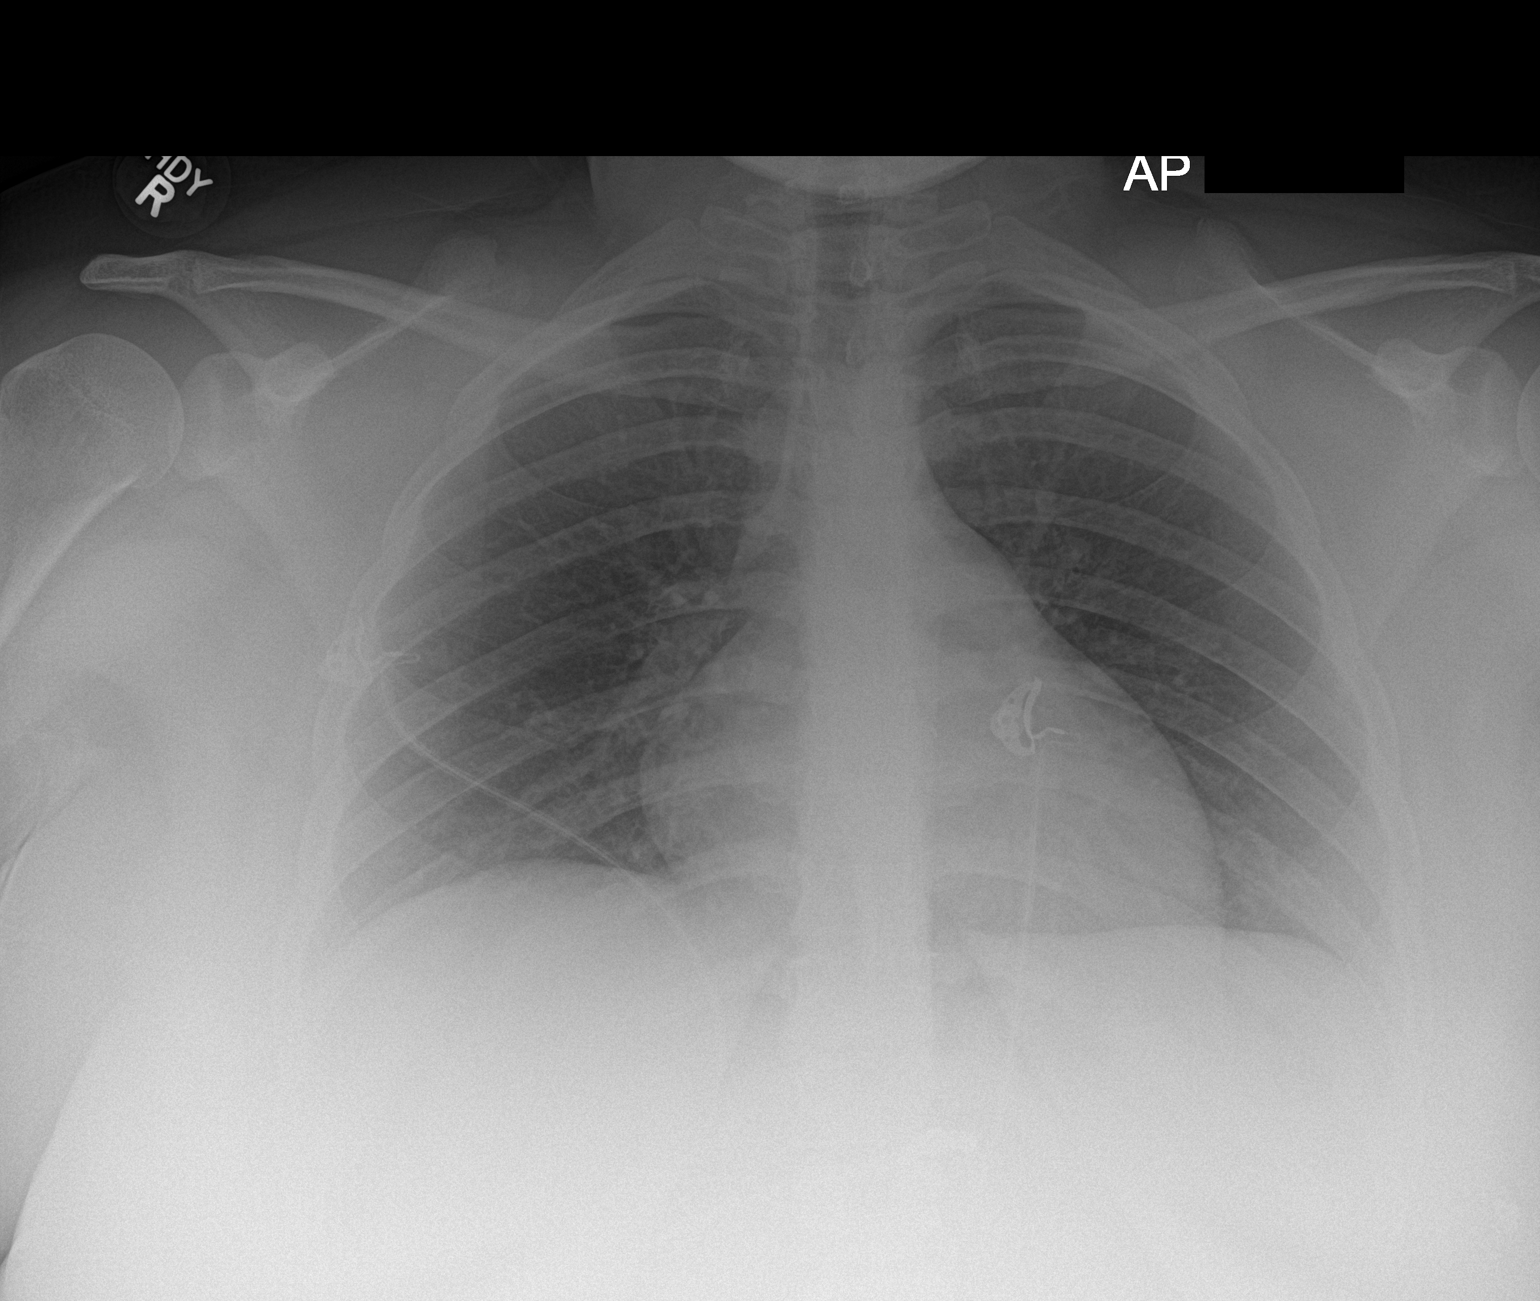

[2 of 2 positions shown; findings below may reference images not displayed]

FINDINGS: Lateral view degraded by patient arm position. Midline trachea.
Normal heart size and mediastinal contours.Sharp costophrenic
angles. No pneumothorax. Clear lungs.
IMPRESSION: No active cardiopulmonary disease.

## 2016-04-13 ENCOUNTER — Other Ambulatory Visit: Payer: Self-pay | Admitting: Family Medicine

## 2016-04-13 DIAGNOSIS — R1011 Right upper quadrant pain: Secondary | ICD-10-CM

## 2016-04-20 ENCOUNTER — Ambulatory Visit (INDEPENDENT_AMBULATORY_CARE_PROVIDER_SITE_OTHER): Payer: Worker's Compensation

## 2016-04-20 ENCOUNTER — Ambulatory Visit: Payer: Worker's Compensation

## 2016-04-20 ENCOUNTER — Ambulatory Visit
Admission: EM | Admit: 2016-04-20 | Discharge: 2016-04-20 | Disposition: A | Discharge status: Home / Self Care | Payer: Worker's Compensation | Attending: Emergency Medicine | Admitting: Emergency Medicine

## 2016-04-20 DIAGNOSIS — S93402A Sprain of unspecified ligament of left ankle, initial encounter: Secondary | ICD-10-CM

## 2016-04-20 DIAGNOSIS — S93432A Sprain of tibiofibular ligament of left ankle, initial encounter: Secondary | ICD-10-CM

## 2016-04-20 DIAGNOSIS — S93492A Sprain of other ligament of left ankle, initial encounter: Secondary | ICD-10-CM

## 2016-04-20 MED ORDER — NAPROXEN 500 MG PO TABS: 500.0000 mg | ORAL_TABLET | Freq: Two times a day (BID) | ORAL | 0 refills | Status: DC

## 2016-04-20 NOTE — ED Triage Notes (Signed)
Patient complains of left ankle pain, states that she tripped last night at work over a broom. Patient states that right ankle has been hurting for over 2 weeks unrelated to incident yesterday. Patient states that she has been unable to put pressure on feet due to pain and swelling.

## 2016-04-20 NOTE — ED Provider Notes (Signed)
CSN: [id number]     Arrival date & time 04/20/16  1653 History   First MD Initiated Contact with Patient 04/20/16 1806     Chief Complaint  Patient presents with  . Ankle Pain   (Consider location/radiation/quality/duration/timing/severity/associated sxs/prior Treatment) HPI  This a 20 year old female who presents with left ankle pain after she fell last night while sweeping the floor at [employer]. He states that she tripped on the broom causing her foot to evert while in his plantar flexion. Since that time she's had pain over the anteromedial aspect. She states that she's been able to walk on it is very uncomfortable and causes her to limp. He states that today she worked at her second job and was on her feet for 6 hours and now is.Painful. She has more swelling over the antero-medial ankle      Past Medical History:  Diagnosis Date  . Asthma   . Migraine   . Murmur   . Obesity   . Sciatic leg pain   . Syncope   . Tachycardia    Past Surgical History:  Procedure Laterality Date  . FOREIGN BODY REMOVAL EAR Right 05/10/2015   Procedure: REMOVAL FOREIGN BODY EAR;  Surgeon: Vernie Murders, MD;  Location: [facility] ORS;  Service: ENT;  Laterality: Right;  . KELOID EXCISION    . MINOR EXCISION EAR CANAL MASS Right 05/10/2015   Procedure: MINOR EXCISION EAR CANAL MASS/EXCISION KELOID AND ADVANCEMENT FLAP REPAIR;  Surgeon: Vernie Murders, MD;  Location: [facility] ORS;  Service: ENT;  Laterality: Right;   History reviewed. No pertinent family history. Social History  Substance Use Topics  . Smoking status: Current Every Day Smoker    Packs/day: 0.20  . Smokeless tobacco: Never Used  . Alcohol use Yes     Comment: occasionally   OB History    No data available     Review of Systems  Constitutional: Positive for activity change. Negative for chills, fatigue and fever.  Musculoskeletal: Positive for gait problem and joint swelling.  All other systems reviewed and are  negative.   Allergies  Penicillins  Home Medications   Prior to Admission medications   Medication Sig Start Date End Date Taking? Authorizing Provider  amitriptyline (ELAVIL) 100 MG tablet Take 100 mg by mouth at bedtime.   Yes Historical Provider, MD  Fluticasone-Salmeterol (ADVAIR) 500-50 MCG/DOSE AEPB Inhale 1 puff into the lungs 2 (two) times daily.   Yes Historical Provider, MD  loratadine (CLARITIN) 10 MG tablet Take 10 mg by mouth daily.   Yes Historical Provider, MD  mometasone (ELOCON) 0.1 % cream Apply 1 application topically daily.   Yes Historical Provider, MD  montelukast (SINGULAIR) 10 MG tablet Take 10 mg by mouth at bedtime.   Yes Historical Provider, MD  naproxen (NAPROSYN) 500 MG tablet Take 1 tablet (500 mg total) by mouth 2 (two) times daily with a meal. 04/20/16   Lutricia Feil, PA-C   Meds Ordered and Administered this Visit  Medications - No data to display  BP 129/64 (BP Location: Right Arm)   Pulse 88   Temp 98.2 F (36.8 C) (Tympanic)   Resp 16   Ht 5\' 5"  (1.651 m)   Wt (!) 308 lb (139.7 kg)   LMP 04/17/2016   SpO2 100%   BMI 51.25 kg/m  No data found.   Physical Exam  Constitutional: She appears well-developed and well-nourished. No distress.  HENT:  Head: Normocephalic and atraumatic.  Eyes: EOM are  normal. Pupils are equal, round, and reactive to light.  Neck: Normal range of motion. Neck supple.  Musculoskeletal:  Examination of the left ankle and foot shows a swelling over the anteromedial just over the anterior deltoid ligament. This extends into the tarsal area where there is significant tenderness near the base of the first metatarsal navicular joint. She also has tenderness with compression of the distal tib-fib joint radiates into her medial ankle. Range of motion is markedly decreased. Subtalar joint motion is intact.  Skin: She is not diaphoretic.  Nursing note and vitals reviewed.   Urgent Care Course   Clinical Course     Procedures (including critical care time)  Labs Review Labs Reviewed - No data to display  Imaging Review Dg Ankle Complete Left  Result Date: 04/20/2016 CLINICAL DATA:  Status post fall, twisting left ankle and foot EXAM: LEFT FOOT - COMPLETE 3+ VIEW; LEFT ANKLE COMPLETE - 3+ VIEW COMPARISON:  None. FINDINGS: There is no fracture of the left ankle. The ankle mortise is approximated. There is mild circumferential soft tissue swelling. No fracture or dislocation of the left foot. No periosteal reaction. Bone density is normal. IMPRESSION: No fracture or dislocation of the left foot or ankle. Electronically Signed   By: Deatra RobinsonKevin  Herman M.D.   On: 04/20/2016 19:16   Dg Foot Complete Left  Result Date: 04/20/2016 CLINICAL DATA:  Status post fall, twisting left ankle and foot EXAM: LEFT FOOT - COMPLETE 3+ VIEW; LEFT ANKLE COMPLETE - 3+ VIEW COMPARISON:  None. FINDINGS: There is no fracture of the left ankle. The ankle mortise is approximated. There is mild circumferential soft tissue swelling. No fracture or dislocation of the left foot. No periosteal reaction. Bone density is normal. IMPRESSION: No fracture or dislocation of the left foot or ankle. Electronically Signed   By: Deatra RobinsonKevin  Herman M.D.   On: 04/20/2016 19:16     Visual Acuity Review  Right Eye Distance:   Left Eye Distance:   Bilateral Distance:    Right Eye Near:   Left Eye Near:    Bilateral Near:     She was fitted for a boot orthosis for use during activity  MDM   1. Left ankle sprain, initial encounter   2. High ankle sprain, left, initial encounter    New Prescriptions   NAPROXEN (NAPROSYN) 500 MG TABLET    Take 1 tablet (500 mg total) by mouth 2 (two) times daily with a meal.  Plan: 1. Test/x-ray results and diagnosis reviewed with patient 2. rx as per orders; risks, benefits, potential side effects reviewed with patient 3. Recommend supportive treatment with Ice and elevation as necessary for comfort. I've given  her Naprosyn for anti-inflammatory effect and pain. The boot orthosis will be used for activities. I have given Her work restrictions for 1 week.Marland Kitchen. [facility] week if necessary.  4. F/u prn if symptoms worsen or don't improve     Lutricia FeilWilliam P Jamariya Davidoff, PA-C 04/20/16 1933    Lutricia FeilWilliam P Emmry Hinsch, PA-C 04/20/16 1935

## 2016-04-21 ENCOUNTER — Ambulatory Visit
Admission: RE | Admit: 2016-04-21 | Discharge: 2016-04-21 | Disposition: A | Discharge status: Home / Self Care | Payer: Medicaid Other | Source: Ambulatory Visit | Attending: Family Medicine | Admitting: Family Medicine

## 2016-04-21 DIAGNOSIS — K760 Fatty (change of) liver, not elsewhere classified: Secondary | ICD-10-CM | POA: Insufficient documentation

## 2016-04-21 DIAGNOSIS — R1011 Right upper quadrant pain: Secondary | ICD-10-CM | POA: Insufficient documentation

## 2016-05-24 DIAGNOSIS — E7800 Pure hypercholesterolemia, unspecified: Secondary | ICD-10-CM | POA: Insufficient documentation

## 2016-07-22 ENCOUNTER — Encounter: Payer: Self-pay | Admitting: Emergency Medicine

## 2016-07-22 ENCOUNTER — Emergency Department
Admission: EM | Admit: 2016-07-22 | Discharge: 2016-07-22 | Disposition: A | Discharge status: Home / Self Care | Payer: Medicaid Other | Attending: Emergency Medicine | Admitting: Emergency Medicine

## 2016-07-22 DIAGNOSIS — F17200 Nicotine dependence, unspecified, uncomplicated: Secondary | ICD-10-CM | POA: Insufficient documentation

## 2016-07-22 DIAGNOSIS — R197 Diarrhea, unspecified: Secondary | ICD-10-CM | POA: Insufficient documentation

## 2016-07-22 DIAGNOSIS — J45909 Unspecified asthma, uncomplicated: Secondary | ICD-10-CM | POA: Diagnosis not present

## 2016-07-22 DIAGNOSIS — M549 Dorsalgia, unspecified: Secondary | ICD-10-CM

## 2016-07-22 DIAGNOSIS — M545 Low back pain: Secondary | ICD-10-CM | POA: Insufficient documentation

## 2016-07-22 DIAGNOSIS — R112 Nausea with vomiting, unspecified: Secondary | ICD-10-CM | POA: Insufficient documentation

## 2016-07-22 LAB — COMPREHENSIVE METABOLIC PANEL
ALT: 17 U/L (ref 14–54)
AST: 19 U/L (ref 15–41)
Albumin: 3.9 g/dL (ref 3.5–5.0)
Alkaline Phosphatase: 62 U/L (ref 38–126)
Anion gap: 8 (ref 5–15)
BUN: 8 mg/dL (ref 6–20)
CHLORIDE: 106 mmol/L (ref 101–111)
CO2: 22 mmol/L (ref 22–32)
Calcium: 8.5 mg/dL — ABNORMAL LOW (ref 8.9–10.3)
Creatinine, Ser: 0.86 mg/dL (ref 0.44–1.00)
Glucose, Bld: 91 mg/dL (ref 65–99)
POTASSIUM: 3.8 mmol/L (ref 3.5–5.1)
SODIUM: 136 mmol/L (ref 135–145)
Total Bilirubin: 0.6 mg/dL (ref 0.3–1.2)
Total Protein: 7.3 g/dL (ref 6.5–8.1)

## 2016-07-22 LAB — URINALYSIS, COMPLETE (UACMP) WITH MICROSCOPIC
BILIRUBIN URINE: NEGATIVE
Bacteria, UA: NONE SEEN
Glucose, UA: NEGATIVE mg/dL
Hgb urine dipstick: NEGATIVE
KETONES UR: NEGATIVE mg/dL
LEUKOCYTES UA: NEGATIVE
Nitrite: NEGATIVE
PH: 5 (ref 5.0–8.0)
PROTEIN: NEGATIVE mg/dL
Specific Gravity, Urine: 1.024 (ref 1.005–1.030)

## 2016-07-22 LAB — WET PREP, GENITAL
CLUE CELLS WET PREP: NONE SEEN
Sperm: NONE SEEN
TRICH WET PREP: NONE SEEN
YEAST WET PREP: NONE SEEN

## 2016-07-22 LAB — CBC
HEMATOCRIT: 36.3 % (ref 35.0–47.0)
HEMOGLOBIN: 12.2 g/dL (ref 12.0–16.0)
MCH: 25.2 pg — ABNORMAL LOW (ref 26.0–34.0)
MCHC: 33.6 g/dL (ref 32.0–36.0)
MCV: 75 fL — AB (ref 80.0–100.0)
Platelets: 275 10*3/uL (ref 150–440)
RBC: 4.84 MIL/uL (ref 3.80–5.20)
RDW: 15.9 % — ABNORMAL HIGH (ref 11.5–14.5)
WBC: 5.2 10*3/uL (ref 3.6–11.0)

## 2016-07-22 LAB — CHLAMYDIA/NGC RT PCR (ARMC ONLY)
CHLAMYDIA TR: NOT DETECTED
N gonorrhoeae: NOT DETECTED

## 2016-07-22 LAB — LIPASE, BLOOD: LIPASE: 18 U/L (ref 11–51)

## 2016-07-22 LAB — POCT PREGNANCY, URINE: PREG TEST UR: NEGATIVE

## 2016-07-22 MED ORDER — CYCLOBENZAPRINE HCL 10 MG PO TABS: 10.0000 mg | ORAL_TABLET | Freq: Three times a day (TID) | ORAL | 0 refills | Status: DC | PRN

## 2016-07-22 MED ORDER — ONDANSETRON HCL 4 MG/2ML IJ SOLN
4.0000 mg | Freq: Once | INTRAMUSCULAR | Status: AC
  Administered 2016-07-22: 4 mg via INTRAVENOUS
  Filled 2016-07-22: qty 2

## 2016-07-22 MED ORDER — MORPHINE SULFATE (PF) 4 MG/ML IV SOLN
4.0000 mg | Freq: Once | INTRAVENOUS | Status: AC
  Administered 2016-07-22: 4 mg via INTRAVENOUS
  Filled 2016-07-22: qty 1

## 2016-07-22 MED ORDER — ONDANSETRON HCL 4 MG PO TABS: 4.0000 mg | ORAL_TABLET | Freq: Three times a day (TID) | ORAL | 0 refills | Status: DC | PRN

## 2016-07-22 MED ORDER — SODIUM CHLORIDE 0.9 % IV BOLUS (SEPSIS)
1000.0000 mL | Freq: Once | INTRAVENOUS | Status: AC
  Administered 2016-07-22: 1000 mL via INTRAVENOUS

## 2016-07-22 MED ORDER — CYCLOBENZAPRINE HCL 10 MG PO TABS
10.0000 mg | ORAL_TABLET | Freq: Once | ORAL | Status: AC
  Administered 2016-07-22: 10 mg via ORAL
  Filled 2016-07-22: qty 1

## 2016-07-22 MED ORDER — KETOROLAC TROMETHAMINE 30 MG/ML IJ SOLN
30.0000 mg | Freq: Once | INTRAMUSCULAR | Status: AC
  Administered 2016-07-22: 30 mg via INTRAVENOUS
  Filled 2016-07-22: qty 1

## 2016-07-22 MED ORDER — IBUPROFEN 800 MG PO TABS: 800.0000 mg | ORAL_TABLET | Freq: Three times a day (TID) | ORAL | 0 refills | Status: DC | PRN

## 2016-07-22 NOTE — ED Notes (Signed)
Patient is really wanting to eat some food. Explained to patient and family the plan of care. Waiting to see how the medication affects the pain and possible CT scan. Patient and family voiced understanding. Will continue to monitor patient

## 2016-07-22 NOTE — ED Notes (Signed)
Patient given turkey sandwich and ginger ale.

## 2016-07-22 NOTE — ED Triage Notes (Signed)
Patient presents to the ED with n/v/d for the last week and a half. Patient also reports bilateral lower back pain since yesterday. Patient reports that she passed out yesterday while at work and a coworker caught her.

## 2016-07-22 NOTE — ED Notes (Addendum)
Patient reports that for the last day an a half to 2 days she has had multiple episode of diarrhea. Patient also having constant nausea with 4 episodes of vomiting. Patient was not sure if she could be pregnant and did not want her family to know if she was. 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 Patient had syncopal episode yesterday. Denies any neck pain. Has hx of syncope.

## 2016-07-22 NOTE — Discharge Instructions (Signed)
You were evaluated for nausea, vomiting, and diarrhea and low back pain, and as we discussed your exam and evaluation are reassuring in the emergency department today. As we discussed, we are holding off on CT scan imaging of the abdomen at this point in time with improvement and suspicion that your symptoms are from viral intestinal illness.  You were treated with symptomatic medications and IV fluids here in the emergency department.  Return to the emergency department for any worsening condition including new or worsening abdominal pain, back pain, weakness, numbness, fever, black or bloody stools, or any other symptoms concerning to you.

## 2016-07-22 NOTE — ED Notes (Signed)
Patient was able to stand up right unlike when patient went to bathroom when patient first got here. Patient also able to take a few steps. Patient states pain in down to about a 4 and is tolerable. Per Dr Shaune PollackLord po challenge.

## 2016-07-22 NOTE — ED Provider Notes (Signed)
[facility] Emergency Department Provider Note ____________________________________________   I have reviewed the triage vital signs and the triage nursing note.  HISTORY  Chief Complaint Emesis   Historian Patient Mother and Grandmother at the bedside  HPI Joann Miller is a 20 y.o. female with a history of asthma, migraines, and obesity as well as history of sciatica, presents today after nausea vomiting and diarrhea with lower abdominal cramping and low back pain since Wednesday, somewhat waxing and waning but persistent. She states yesterday she was at work she got lightheaded and passed out. No chest pain or palpitations. No focal weakness or numbness. She did have mild global headache consistent with previous migraines, but somewhat improved.  She is concerned about dehydration given fluid losses with vomiting and diarrhea. Denies fever or chills. States that she has mild vaginal discharge. Denies dysuria. Denies pain going down the legs.  Symptoms are moderate.    Past Medical History:  Diagnosis Date  . Asthma   . Migraine   . Murmur   . Obesity   . Sciatic leg pain   . Syncope   . Tachycardia     There are no active problems to display for this patient.   Past Surgical History:  Procedure Laterality Date  . FOREIGN BODY REMOVAL EAR Right 05/10/2015   Procedure: REMOVAL FOREIGN BODY EAR;  Surgeon: Vernie Murders, MD;  Location: [facility] ORS;  Service: ENT;  Laterality: Right;  . KELOID EXCISION    . MINOR EXCISION EAR CANAL MASS Right 05/10/2015   Procedure: MINOR EXCISION EAR CANAL MASS/EXCISION KELOID AND ADVANCEMENT FLAP REPAIR;  Surgeon: Vernie Murders, MD;  Location: [facility] ORS;  Service: ENT;  Laterality: Right;    Prior to Admission medications   Medication Sig Start Date End Date Taking? Authorizing Provider  amitriptyline (ELAVIL) 100 MG tablet Take 100 mg by mouth at bedtime.    Historical Provider, MD  cyclobenzaprine (FLEXERIL) 10 MG  tablet Take 1 tablet (10 mg total) by mouth 3 (three) times daily as needed for muscle spasms. 07/22/16   Governor Rooks, MD  Fluticasone-Salmeterol (ADVAIR) 500-50 MCG/DOSE AEPB Inhale 1 puff into the lungs 2 (two) times daily.    Historical Provider, MD  ibuprofen (ADVIL,MOTRIN) 800 MG tablet Take 1 tablet (800 mg total) by mouth every 8 (eight) hours as needed. 07/22/16   Governor Rooks, MD  loratadine (CLARITIN) 10 MG tablet Take 10 mg by mouth daily.    Historical Provider, MD  mometasone (ELOCON) 0.1 % cream Apply 1 application topically daily.    Historical Provider, MD  montelukast (SINGULAIR) 10 MG tablet Take 10 mg by mouth at bedtime.    Historical Provider, MD  naproxen (NAPROSYN) 500 MG tablet Take 1 tablet (500 mg total) by mouth 2 (two) times daily with a meal. 04/20/16   Lutricia Feil, PA-C  ondansetron (ZOFRAN) 4 MG tablet Take 1 tablet (4 mg total) by mouth every 8 (eight) hours as needed for nausea or vomiting. 07/22/16   Governor Rooks, MD    Allergies  Allergen Reactions  . Penicillins Rash    No family history on file.  Social History Social History  Substance Use Topics  . Smoking status: Current Some Day Smoker    Packs/day: 0.20  . Smokeless tobacco: Never Used  . Alcohol use Yes     Comment: occasionally    Review of Systems  Constitutional: Negative for fever. Eyes: Negative for visual changes. ENT: Negative for sore throat. Cardiovascular: Negative for  chest pain. Respiratory: Negative for shortness of breath or cough. Gastrointestinal: As per history of present illness. Genitourinary: Negative for dysuria. Musculoskeletal: Positive for low bilateral back pain. Skin: Negative for rash. Neurological: Negative for headache. 10 point Review of Systems otherwise negative ____________________________________________   PHYSICAL EXAM:  VITAL SIGNS: ED Triage Vitals  Enc Vitals Group     BP 07/22/16 0832 134/75     Pulse Rate 07/22/16 0832 96     Resp  07/22/16 0832 18     Temp 07/22/16 0832 98.6 F (37 C)     Temp Source 07/22/16 0832 Oral     SpO2 07/22/16 0832 97 %     Weight 07/22/16 0835 (!) 315 lb (142.9 kg)     Height 07/22/16 0835 5\' 5"  (1.651 m)     Head Circumference --      Peak Flow --      Pain Score 07/22/16 0835 7     Pain Loc --      Pain Edu? --      Excl. in GC? --      Constitutional: Alert and oriented. Well appearing and in no distress. HEENT   Head: Normocephalic and atraumatic.      Eyes: Conjunctivae are normal. PERRL. Normal extraocular movements.      Ears:         Nose: No congestion/rhinnorhea.   Mouth/Throat: Mucous membranes are Moderately dry.   Neck: No stridor. Cardiovascular/Chest: Normal rate, regular rhythm.  No murmurs, rubs, or gallops. Respiratory: Normal respiratory effort without tachypnea nor retractions. Breath sounds are clear and equal bilaterally. No wheezes/rales/rhonchi. Gastrointestinal: Soft. No distention, no guarding, no rebound. Obese. Mild tenderness diffusely without focal right lower quadrant tenderness.  Genitourinary/rectal:Small amount of white vaginal discharge. No cervicitis.  No adnexal tenderness  Musculoskeletal: Nontender with normal range of motion in all extremities. No joint effusions.  No lower extremity tenderness.  No edema. Neurologic:  Normal speech and language. No gross or focal neurologic deficits are appreciated. Skin:  Skin is warm, dry and intact. No rash noted. Psychiatric: Mood and affect are normal. Speech and behavior are normal. Patient exhibits appropriate insight and judgment.   ____________________________________________  LABS (pertinent positives/negatives)  Labs Reviewed  WET PREP, GENITAL - Abnormal; Notable for the following:       Result Value   WBC, Wet Prep HPF POC RARE (*)    All other components within normal limits  COMPREHENSIVE METABOLIC PANEL - Abnormal; Notable for the following:    Calcium 8.5 (*)    All  other components within normal limits  CBC - Abnormal; Notable for the following:    MCV 75.0 (*)    MCH 25.2 (*)    RDW 15.9 (*)    All other components within normal limits  URINALYSIS, COMPLETE (UACMP) WITH MICROSCOPIC - Abnormal; Notable for the following:    Color, Urine YELLOW (*)    APPearance HAZY (*)    Squamous Epithelial / LPF 6-30 (*)    All other components within normal limits  CHLAMYDIA/NGC RT PCR ([facility] ONLY)  LIPASE, BLOOD  POCT PREGNANCY, URINE  POC URINE PREG, ED    ____________________________________________    EKG I, Governor Rooksebecca Lillianah Swartzentruber, MD, the attending physician have personally viewed and interpreted all ECGs.  83 bpm. Normal sinus rhythm. Narrow QRS. Normal axis. Nonspecific ST and T-wave ____________________________________________  RADIOLOGY All Xrays were viewed by me. Imaging interpreted by Radiologist.  None __________________________________________  PROCEDURES  Procedure(s) performed: None  Critical Care performed: None  ____________________________________________   ED COURSE / ASSESSMENT AND PLAN  Pertinent labs & imaging results that were available during my care of the patient were reviewed by me and considered in my medical decision making (see chart for details).   Joann Miller is here for vomiting and diarrhea for a few days, also now low cramping abdominal pain that radiates into her low back, and an episode of syncope yesterday.  In terms of the syncope, probably vasovagal versus dehydration related, without cardiac symptoms. EKG is reassuring.  In terms of vomiting and diarrhea, reassuring/stable vital signs and laboratory studies without acute renal failure I disturbance. However she does have some clinical mild to moderate dehydration and I did give her a 1 L normal saline bolus.  In terms of the lower abdominal cramping and discomfort into the low back, I did perform pelvic exam and no evidence of cervicitis.  Urinalysis negative for evidence of urinary tract infection.  Patient received additional dose of Sinemet medications IV Rocephin and Flexeril for low back soreness which I suspect is actually a musculoskeletal pain as it's worse when she moves around when I press on bilateral lower lumbar area.   I press her abdomen again, no focal pain especially in the lower abdomen in terms of concern for ovarian cyst or ovarian torsion or appendicitis. I discussed with her and she agrees this does not feel one-sided and we discussed risks versus benefit of CT scan and I'm not recommending at this point time and she feels comfortable with this plan.     CONSULTATIONS:   None   Patient / Family / Caregiver informed of clinical course, medical decision-making process, and agree with plan.   I discussed return precautions, follow-up instructions, and discharge instructions with patient and/or family.   ___________________________________________   FINAL CLINICAL IMPRESSION(S) / ED DIAGNOSES   Final diagnoses:  Nausea vomiting and diarrhea  Musculoskeletal back pain              Note: This dictation was prepared with Dragon dictation. Any transcriptional errors that result from this process are unintentional    Governor Rooksebecca Jakaria Lavergne, MD 07/22/16 1309

## 2016-08-20 ENCOUNTER — Encounter: Payer: Self-pay | Admitting: Emergency Medicine

## 2016-08-20 ENCOUNTER — Emergency Department
Admission: EM | Admit: 2016-08-20 | Discharge: 2016-08-20 | Disposition: A | Discharge status: Home / Self Care | Payer: Medicaid Other | Attending: Emergency Medicine | Admitting: Emergency Medicine

## 2016-08-20 DIAGNOSIS — B349 Viral infection, unspecified: Secondary | ICD-10-CM | POA: Diagnosis not present

## 2016-08-20 DIAGNOSIS — J45909 Unspecified asthma, uncomplicated: Secondary | ICD-10-CM | POA: Diagnosis not present

## 2016-08-20 DIAGNOSIS — R55 Syncope and collapse: Secondary | ICD-10-CM | POA: Insufficient documentation

## 2016-08-20 DIAGNOSIS — Z79899 Other long term (current) drug therapy: Secondary | ICD-10-CM | POA: Diagnosis not present

## 2016-08-20 DIAGNOSIS — R509 Fever, unspecified: Secondary | ICD-10-CM | POA: Diagnosis present

## 2016-08-20 DIAGNOSIS — F17200 Nicotine dependence, unspecified, uncomplicated: Secondary | ICD-10-CM | POA: Diagnosis not present

## 2016-08-20 LAB — CBC
HEMATOCRIT: 35.6 % (ref 35.0–47.0)
HEMOGLOBIN: 12.1 g/dL (ref 12.0–16.0)
MCH: 25.6 pg — AB (ref 26.0–34.0)
MCHC: 34 g/dL (ref 32.0–36.0)
MCV: 75.4 fL — ABNORMAL LOW (ref 80.0–100.0)
Platelets: 307 10*3/uL (ref 150–440)
RBC: 4.72 MIL/uL (ref 3.80–5.20)
RDW: 16.1 % — ABNORMAL HIGH (ref 11.5–14.5)
WBC: 6.7 10*3/uL (ref 3.6–11.0)

## 2016-08-20 LAB — URINALYSIS, COMPLETE (UACMP) WITH MICROSCOPIC
BACTERIA UA: NONE SEEN
Bilirubin Urine: NEGATIVE
Glucose, UA: 50 mg/dL — AB
HGB URINE DIPSTICK: NEGATIVE
KETONES UR: 5 mg/dL — AB
Leukocytes, UA: NEGATIVE
NITRITE: NEGATIVE
PROTEIN: 30 mg/dL — AB
Specific Gravity, Urine: 1.029 (ref 1.005–1.030)
pH: 6 (ref 5.0–8.0)

## 2016-08-20 LAB — COMPREHENSIVE METABOLIC PANEL
ALT: 21 U/L (ref 14–54)
ANION GAP: 7 (ref 5–15)
AST: 19 U/L (ref 15–41)
Albumin: 3.9 g/dL (ref 3.5–5.0)
Alkaline Phosphatase: 55 U/L (ref 38–126)
BUN: 9 mg/dL (ref 6–20)
CHLORIDE: 105 mmol/L (ref 101–111)
CO2: 27 mmol/L (ref 22–32)
Calcium: 8.9 mg/dL (ref 8.9–10.3)
Creatinine, Ser: 1.02 mg/dL — ABNORMAL HIGH (ref 0.44–1.00)
Glucose, Bld: 91 mg/dL (ref 65–99)
POTASSIUM: 3.5 mmol/L (ref 3.5–5.1)
Sodium: 139 mmol/L (ref 135–145)
Total Bilirubin: 0.4 mg/dL (ref 0.3–1.2)
Total Protein: 7.2 g/dL (ref 6.5–8.1)

## 2016-08-20 LAB — INFLUENZA PANEL BY PCR (TYPE A & B)
INFLAPCR: NEGATIVE
INFLBPCR: NEGATIVE

## 2016-08-20 LAB — LIPASE, BLOOD: LIPASE: 28 U/L (ref 11–51)

## 2016-08-20 LAB — PREGNANCY, URINE: PREG TEST UR: NEGATIVE

## 2016-08-20 MED ORDER — METOCLOPRAMIDE HCL 5 MG/ML IJ SOLN
10.0000 mg | Freq: Once | INTRAMUSCULAR | Status: AC
  Administered 2016-08-20: 10 mg via INTRAVENOUS

## 2016-08-20 MED ORDER — METOCLOPRAMIDE HCL 5 MG/ML IJ SOLN
INTRAMUSCULAR | Status: AC
  Administered 2016-08-20: 10 mg via INTRAVENOUS
  Filled 2016-08-20: qty 2

## 2016-08-20 MED ORDER — BUTALBITAL-APAP-CAFFEINE 50-325-40 MG PO TABS: 1.0000 | ORAL_TABLET | Freq: Four times a day (QID) | ORAL | 0 refills | Status: AC | PRN

## 2016-08-20 MED ORDER — DIPHENHYDRAMINE HCL 50 MG/ML IJ SOLN
25.0000 mg | Freq: Once | INTRAMUSCULAR | Status: AC
  Administered 2016-08-20: 25 mg via INTRAVENOUS
  Filled 2016-08-20: qty 1

## 2016-08-20 MED ORDER — KETOROLAC TROMETHAMINE 30 MG/ML IJ SOLN
30.0000 mg | Freq: Once | INTRAMUSCULAR | Status: AC
  Administered 2016-08-20: 30 mg via INTRAVENOUS
  Filled 2016-08-20: qty 1

## 2016-08-20 MED ORDER — ONDANSETRON 4 MG PO TBDP: 4.0000 mg | ORAL_TABLET | Freq: Three times a day (TID) | ORAL | 0 refills | Status: DC | PRN

## 2016-08-20 MED ORDER — SODIUM CHLORIDE 0.9 % IV BOLUS (SEPSIS)
1000.0000 mL | Freq: Once | INTRAVENOUS | Status: AC
  Administered 2016-08-20: 1000 mL via INTRAVENOUS

## 2016-08-20 NOTE — ED Notes (Signed)
Vomiting and migraine HA, states she has hx of migraines.

## 2016-08-20 NOTE — ED Provider Notes (Signed)
[facility] Emergency Department Provider Note  Time seen: 4:42 PM  I have reviewed the triage vital signs and the nursing notes.   HISTORY  Chief Complaint Migraine and Loss of Consciousness    HPI Joann Miller is a 21 y.o. female with a past medical history of obesity, migraines, asthma, presents to the emergency department with viral symptoms and loss of consciousness. According to the patient for the past 3 days she has been feeling subjective fever, sinus congestion, nasal congestion, with mild cough. She states yesterday at work she had a syncopal episode. Mom states the patient has a history of multiple syncopal episodes in the past. Patient denies any chest pain or shortness of breath. Patient states subjective chills currently denies any other symptoms at this time.  Past Medical History:  Diagnosis Date  . Asthma   . Migraine   . Murmur   . Obesity   . Sciatic leg pain   . Syncope   . Tachycardia     There are no active problems to display for this patient.   Past Surgical History:  Procedure Laterality Date  . FOREIGN BODY REMOVAL EAR Right 05/10/2015   Procedure: REMOVAL FOREIGN BODY EAR;  Surgeon: Vernie MurdersPaul Juengel, MD;  Location: [facility] ORS;  Service: ENT;  Laterality: Right;  . KELOID EXCISION    . MINOR EXCISION EAR CANAL MASS Right 05/10/2015   Procedure: MINOR EXCISION EAR CANAL MASS/EXCISION KELOID AND ADVANCEMENT FLAP REPAIR;  Surgeon: Vernie MurdersPaul Juengel, MD;  Location: [facility] ORS;  Service: ENT;  Laterality: Right;    Prior to Admission medications   Medication Sig Start Date End Date Taking? Authorizing Provider  amitriptyline (ELAVIL) 100 MG tablet Take 100 mg by mouth at bedtime.    Historical Provider, MD  cyclobenzaprine (FLEXERIL) 10 MG tablet Take 1 tablet (10 mg total) by mouth 3 (three) times daily as needed for muscle spasms. 07/22/16   Governor Rooksebecca Lord, MD  Fluticasone-Salmeterol (ADVAIR) 500-50 MCG/DOSE AEPB Inhale 1 puff into the lungs  2 (two) times daily.    Historical Provider, MD  ibuprofen (ADVIL,MOTRIN) 800 MG tablet Take 1 tablet (800 mg total) by mouth every 8 (eight) hours as needed. 07/22/16   Governor Rooksebecca Lord, MD  loratadine (CLARITIN) 10 MG tablet Take 10 mg by mouth daily.    Historical Provider, MD  mometasone (ELOCON) 0.1 % cream Apply 1 application topically daily.    Historical Provider, MD  montelukast (SINGULAIR) 10 MG tablet Take 10 mg by mouth at bedtime.    Historical Provider, MD  naproxen (NAPROSYN) 500 MG tablet Take 1 tablet (500 mg total) by mouth 2 (two) times daily with a meal. 04/20/16   Lutricia FeilWilliam P Roemer, PA-C  ondansetron (ZOFRAN) 4 MG tablet Take 1 tablet (4 mg total) by mouth every 8 (eight) hours as needed for nausea or vomiting. 07/22/16   Governor Rooksebecca Lord, MD    Allergies  Allergen Reactions  . Penicillins Rash    History reviewed. No pertinent family history.  Social History Social History  Substance Use Topics  . Smoking status: Current Some Day Smoker    Packs/day: 0.20  . Smokeless tobacco: Never Used  . Alcohol use Yes     Comment: occasionally    Review of Systems Constitutional: Subjective fever. Positive for nasal congestion. Cardiovascular: Negative for chest pain. Respiratory: Negative for shortness of breath. Gastrointestinal: Negative for abdominal pain Genitourinary: Negative for dysuria. Neurological: Mild headache. 10-point ROS otherwise negative.  ____________________________________________   PHYSICAL EXAM:  VITAL SIGNS: ED Triage Vitals  Enc Vitals Group     BP 08/20/16 1406 135/69     Pulse Rate 08/20/16 1406 88     Resp 08/20/16 1406 18     Temp 08/20/16 1406 98.5 F (36.9 C)     Temp Source 08/20/16 1406 Oral     SpO2 08/20/16 1406 98 %     Weight 08/20/16 1406 (!) 320 lb (145.2 kg)     Height 08/20/16 1406 5\' 5"  (1.651 m)     Head Circumference --      Peak Flow --      Pain Score 08/20/16 1416 10     Pain Loc --      Pain Edu? --      Excl. in  GC? --     Constitutional: Alert and oriented. Well appearing and in no distress. Eyes: Normal exam ENT   Head: Normocephalic and atraumatic.No noted nasal congestion.   Mouth/Throat: Mucous membranes are moist. Cardiovascular: Normal rate, regular rhythm. No murmur Respiratory: Normal respiratory effort without tachypnea nor retractions. Breath sounds are clear  Gastrointestinal: Soft and nontender. No distention.  Obese. Musculoskeletal: Nontender with normal range of motion in all extremities.  Neurologic:  Normal speech and language. No gross focal neurologic deficits  Skin:  Skin is warm, dry and intact.  Psychiatric: Mood and affect are normal  ____________________________________________    EKG  EKG reviewed and interpreted by myself shows normal sinus rhythm at 84 bpm. Narrow QRS, normal axis, normal intervals, no concerning ST changes.     INITIAL IMPRESSION / ASSESSMENT AND PLAN / ED COURSE  Pertinent labs & imaging results that were available during my care of the patient were reviewed by me and considered in my medical decision making (see chart for details).  The patient presents the emergency department with viral symptoms of subjective fever, body aches, headache, nasal congestion and cough. States a syncopal episode yesterday while at work. Denies any chest pain or shortness of breath at any point. Patient's labs are largely within normal limits. Vitals are reassuring. Currently afebrile. We will check an influenza swab, treat with IV fluids and migraine medications. Overall the patient appears well, nontoxic  Work up is negative. Influenza negative. We will discharge home with supportive care.  ____________________________________________   FINAL CLINICAL IMPRESSION(S) / ED DIAGNOSES  Viral syndrome Syncope    Minna Antis, MD 08/20/16 1900

## 2016-08-20 NOTE — ED Triage Notes (Signed)
Pt c/o 24 hr stomach bug middle last week but is still having vomiting today just not as much. Pt reports syncope last night while at work.  Has had headache since Wednesday of last week as well. Denies diarrhea. No fevers. Has had cough and pain with cough. Cough present X 3 weeks.

## 2017-01-14 ENCOUNTER — Emergency Department: Payer: Medicaid Other

## 2017-01-14 ENCOUNTER — Emergency Department
Admission: EM | Admit: 2017-01-14 | Discharge: 2017-01-14 | Disposition: A | Discharge status: Home / Self Care | Payer: Medicaid Other | Attending: Emergency Medicine | Admitting: Emergency Medicine

## 2017-01-14 DIAGNOSIS — R000 Tachycardia, unspecified: Secondary | ICD-10-CM | POA: Diagnosis not present

## 2017-01-14 DIAGNOSIS — R0789 Other chest pain: Secondary | ICD-10-CM | POA: Diagnosis present

## 2017-01-14 DIAGNOSIS — Z7951 Long term (current) use of inhaled steroids: Secondary | ICD-10-CM | POA: Insufficient documentation

## 2017-01-14 DIAGNOSIS — J189 Pneumonia, unspecified organism: Secondary | ICD-10-CM

## 2017-01-14 DIAGNOSIS — J45901 Unspecified asthma with (acute) exacerbation: Secondary | ICD-10-CM | POA: Diagnosis not present

## 2017-01-14 DIAGNOSIS — R011 Cardiac murmur, unspecified: Secondary | ICD-10-CM | POA: Diagnosis not present

## 2017-01-14 DIAGNOSIS — Z880 Allergy status to penicillin: Secondary | ICD-10-CM | POA: Diagnosis not present

## 2017-01-14 DIAGNOSIS — Z79899 Other long term (current) drug therapy: Secondary | ICD-10-CM | POA: Insufficient documentation

## 2017-01-14 LAB — BASIC METABOLIC PANEL
ANION GAP: 6 (ref 5–15)
BUN: 7 mg/dL (ref 6–20)
CO2: 23 mmol/L (ref 22–32)
Calcium: 9.1 mg/dL (ref 8.9–10.3)
Chloride: 108 mmol/L (ref 101–111)
Creatinine, Ser: 0.82 mg/dL (ref 0.44–1.00)
GFR calc Af Amer: >60 mL/min (ref >60)
GLUCOSE: 90 mg/dL (ref 65–99)
Potassium: 3.4 mmol/L — ABNORMAL LOW (ref 3.5–5.1)
SODIUM: 137 mmol/L (ref 135–145)

## 2017-01-14 LAB — CBC
HCT: 36.1 % (ref 35.0–47.0)
HEMOGLOBIN: 12.2 g/dL (ref 12.0–16.0)
MCH: 25.8 pg — ABNORMAL LOW (ref 26.0–34.0)
MCHC: 33.9 g/dL (ref 32.0–36.0)
MCV: 76.2 fL — ABNORMAL LOW (ref 80.0–100.0)
Platelets: 316 10*3/uL (ref 150–440)
RBC: 4.73 MIL/uL (ref 3.80–5.20)
RDW: 15.4 % — ABNORMAL HIGH (ref 11.5–14.5)
WBC: 7 10*3/uL (ref 3.6–11.0)

## 2017-01-14 LAB — TROPONIN I

## 2017-01-14 MED ORDER — PREDNISONE 50 MG PO TABS: ORAL_TABLET | ORAL | 0 refills | Status: DC

## 2017-01-14 MED ORDER — PREDNISONE 20 MG PO TABS
60.0000 mg | ORAL_TABLET | Freq: Once | ORAL | Status: AC
  Administered 2017-01-14: 60 mg via ORAL
  Filled 2017-01-14: qty 3

## 2017-01-14 MED ORDER — BENZONATATE 100 MG PO CAPS: 100.0000 mg | ORAL_CAPSULE | Freq: Four times a day (QID) | ORAL | 0 refills | Status: DC | PRN

## 2017-01-14 MED ORDER — DOXYCYCLINE HYCLATE 100 MG PO CAPS: 100.0000 mg | ORAL_CAPSULE | Freq: Two times a day (BID) | ORAL | 0 refills | Status: DC

## 2017-01-14 MED ORDER — LEVOFLOXACIN 750 MG PO TABS
750.0000 mg | ORAL_TABLET | Freq: Once | ORAL | Status: AC
  Administered 2017-01-14: 750 mg via ORAL
  Filled 2017-01-14: qty 1

## 2017-01-14 MED ORDER — IPRATROPIUM-ALBUTEROL 0.5-2.5 (3) MG/3ML IN SOLN
9.0000 mL | Freq: Once | RESPIRATORY_TRACT | Status: AC
  Administered 2017-01-14: 9 mL via RESPIRATORY_TRACT
  Filled 2017-01-14: qty 9

## 2017-01-14 NOTE — ED Provider Notes (Signed)
[facility] Emergency Department Provider Note  ____________________________________________   First MD Initiated Contact with Patient 01/14/17 1516     (approximate)  I have reviewed the triage vital signs and the nursing notes.   HISTORY  Chief Complaint Chest Pain   HPI Joann Miller is a 21 y.o. female history of asthma as well as syncope and heart murmur was presenting emergency department today with 3 days of constant chest pain across the front of her chest as well as a cough productive of green sputum. She also reports that she is blowing her nose with clear mucous and also with 2 nosebleeds today. Patient says that the pain worsens with her cough. Has been having on and off symptoms for the past 2 months and was treated with a Z-Pak 2 months ago for sinus infection.  Patient does not report any radiation of the chest pain.   Past Medical History:  Diagnosis Date  . Asthma   . Migraine   . Murmur   . Obesity   . Sciatic leg pain   . Syncope   . Tachycardia     There are no active problems to display for this patient.   Past Surgical History:  Procedure Laterality Date  . FOREIGN BODY REMOVAL EAR Right 05/10/2015   Procedure: REMOVAL FOREIGN BODY EAR;  Surgeon: Vernie Murders, MD;  Location: [facility] ORS;  Service: ENT;  Laterality: Right;  . KELOID EXCISION    . MINOR EXCISION EAR CANAL MASS Right 05/10/2015   Procedure: MINOR EXCISION EAR CANAL MASS/EXCISION KELOID AND ADVANCEMENT FLAP REPAIR;  Surgeon: Vernie Murders, MD;  Location: [facility] ORS;  Service: ENT;  Laterality: Right;    Prior to Admission medications   Medication Sig Start Date End Date Taking? Authorizing Provider  amitriptyline (ELAVIL) 100 MG tablet Take 100 mg by mouth at bedtime.    [provider]  butalbital-acetaminophen-caffeine (FIORICET, ESGIC) [phone] MG tablet Take 1-2 tablets by mouth every 6 (six) hours as needed for headache. 08/20/16 08/20/17  Minna Antis, MD  cyclobenzaprine (FLEXERIL) 10 MG tablet Take 1 tablet (10 mg total) by mouth 3 (three) times daily as needed for muscle spasms. 07/22/16   Governor Rooks, MD  Fluticasone-Salmeterol (ADVAIR) 500-50 MCG/DOSE AEPB Inhale 1 puff into the lungs 2 (two) times daily.    [provider]  ibuprofen (ADVIL,MOTRIN) 800 MG tablet Take 1 tablet (800 mg total) by mouth every 8 (eight) hours as needed. 07/22/16   Governor Rooks, MD  loratadine (CLARITIN) 10 MG tablet Take 10 mg by mouth daily.    [provider]  mometasone (ELOCON) 0.1 % cream Apply 1 application topically daily.    [provider]  montelukast (SINGULAIR) 10 MG tablet Take 10 mg by mouth at bedtime.    [provider]  naproxen (NAPROSYN) 500 MG tablet Take 1 tablet (500 mg total) by mouth 2 (two) times daily with a meal. 04/20/16   Lutricia Feil, PA-C  ondansetron (ZOFRAN ODT) 4 MG disintegrating tablet Take 1 tablet (4 mg total) by mouth every 8 (eight) hours as needed for nausea or vomiting. 08/20/16   Minna Antis, MD  ondansetron (ZOFRAN) 4 MG tablet Take 1 tablet (4 mg total) by mouth every 8 (eight) hours as needed for nausea or vomiting. 07/22/16   Governor Rooks, MD    Allergies Penicillins  No family history on file.  Social History Social History  Substance Use Topics  . Smoking status: Current Some Day  Smoker    Packs/day: 0.20  . Smokeless tobacco: Never Used  . Alcohol use Yes     Comment: occasionally    Review of Systems  Constitutional: No fever/chills Eyes: No visual changes. ENT: No sore throat. Cardiovascular: as above Respiratory:  As above. Gastrointestinal: No abdominal pain.  No nausea, no vomiting.  No diarrhea.  No constipation. Genitourinary: Negative for dysuria. Musculoskeletal: Negative for back pain. Skin: Negative for rash. Neurological: Negative for headaches, focal weakness or  numbness.   ____________________________________________   PHYSICAL EXAM:  VITAL SIGNS: ED Triage Vitals  Enc Vitals Group     BP 01/14/17 1337 127/89     Pulse Rate 01/14/17 1333 (!) 104     Resp 01/14/17 1333 20     Temp 01/14/17 1333 99.2 F (37.3 C)     Temp Source 01/14/17 1333 Oral     SpO2 01/14/17 1333 97 %     Weight 01/14/17 1333 (!) 330 lb (149.7 kg)     Height 01/14/17 1333 5\' 5"  (1.651 m)     Head Circumference --      Peak Flow --      Pain Score 01/14/17 1333 8     Pain Loc --      Pain Edu? --      Excl. in GC? --     Constitutional: Alert and oriented. Well appearing and in no acute distress. Eyes: Conjunctivae are normal.   Head: Atraumatic. Nose: No congestion/rhinnorhea.However, the mucosa is erythematous but without any active bleeding at this time. Mouth/Throat: Mucous membranes are moist.  Neck: No stridor.   Cardiovascular: Normal rate, regular rhythm. Grossly normal heart sounds.  Chest pain is highly reproducible with palpation across the front of the chest. Respiratory: Normal respiratory effort.  Wheezing throughout. Prolonged expiratory phase. Gastrointestinal: Soft and nontender. No distention.  Musculoskeletal: No lower extremity tenderness nor edema.  No joint effusions. Neurologic:  Normal speech and language. No gross focal neurologic deficits are appreciated. Skin:  Skin is warm, dry and intact. No rash noted. Psychiatric: Mood and affect are normal. Speech and behavior are normal.  ____________________________________________   LABS (all labs ordered are listed, but only abnormal results are displayed)  Labs Reviewed  BASIC METABOLIC PANEL - Abnormal; Notable for the following:       Result Value   Potassium 3.4 (*)    All other components within normal limits  CBC - Abnormal; Notable for the following:    MCV 76.2 (*)    MCH 25.8 (*)    RDW 15.4 (*)    All other components within normal limits  TROPONIN I  TROPONIN I    ____________________________________________  EKG  ED ECG REPORT I, Arelia LongestSchaevitz,  Shermar Friedland M, the attending physician, personally viewed and interpreted this ECG.   Date: 01/14/2017  EKG Time: 1343  Rate: 90  Rhythm: normal sinus rhythm  Axis: Normal  Intervals:Mildly prolonged QTC at 462.  ST&T Change: No ST segment elevation or depression. T-wave inversions in 2, 3, aVF as well as V2 through V4. Patient with multiple EKGs with T-wave abnormalities in the past. Similar to current EKG. ____________________________________________  RADIOLOGY  Chest x-ray is concerning for bilateral opacities. ____________________________________________   PROCEDURES  Procedure(s) performed:   Procedures  Critical Care performed:   ____________________________________________   INITIAL IMPRESSION / ASSESSMENT AND PLAN / ED COURSE  Pertinent labs & imaging results that were available during my care of the patient were reviewed by me and considered  in my medical decision making (see chart for details).    Clinical Course as of Jan 14 1858  Sun Jan 14, 2017  1541 DG Chest 2 View [JT]    Clinical Course User Index [JT] Maryjane Hurter   ----------------------------------------- 6:47 PM on 01/14/2017 -----------------------------------------  Patient at this point feels much improved. Her voice which was hoarse before is now normal. I re-auscultated her lungs and there is no wheezing. Second troponin is negative. Patient with a mildly prolonged QTC. Will be discharged on doxycycline instead of Levaquin. She will be following up with her primary care doctor at [facility]. We'll also discharge with prednisone. She has not been overall inhaler at home for use if any shortness of breath returns. She is understanding of this plan and willing to comply. Family is also requesting Tessalon Perles and she'll be discharged home with these as  well.  ____________________________________________   FINAL CLINICAL IMPRESSION(S) / ED DIAGNOSES  Community acquired pneumonia. Asthma exacerbation.    NEW MEDICATIONS STARTED DURING THIS VISIT:  New Prescriptions   No medications on file     Note:  This document was prepared using Dragon voice recognition software and may include unintentional dictation errors.     Myrna Blazer, MD 01/14/17 [phone]

## 2017-01-14 NOTE — ED Triage Notes (Signed)
Pt bib EMS w/ c/o chest tightness. Pt sts chest tightness of been intermittent w/ cough x 2 months. Pt sts tightness worse x 1 week. Pt sts tightness in mid chest w/ radiation to back. Pt has hoarse voice. Resp even and unlabored. Denies N/V/D. Pt a/ox4. NAD.

## 2017-01-14 NOTE — ED Notes (Signed)

## 2017-11-07 DIAGNOSIS — I10 Essential (primary) hypertension: Secondary | ICD-10-CM | POA: Insufficient documentation

## 2017-11-07 DIAGNOSIS — O10919 Unspecified pre-existing hypertension complicating pregnancy, unspecified trimester: Secondary | ICD-10-CM | POA: Insufficient documentation

## 2017-11-07 HISTORY — DX: Essential (primary) hypertension: I10

## 2017-11-22 ENCOUNTER — Emergency Department
Admission: EM | Admit: 2017-11-22 | Discharge: 2017-11-22 | Disposition: A | Discharge status: Home / Self Care | Payer: BLUE CROSS/BLUE SHIELD | Attending: Emergency Medicine | Admitting: Emergency Medicine

## 2017-11-22 ENCOUNTER — Emergency Department: Payer: BLUE CROSS/BLUE SHIELD

## 2017-11-22 ENCOUNTER — Encounter: Payer: Self-pay | Admitting: Emergency Medicine

## 2017-11-22 DIAGNOSIS — R1013 Epigastric pain: Secondary | ICD-10-CM | POA: Diagnosis not present

## 2017-11-22 DIAGNOSIS — Z79899 Other long term (current) drug therapy: Secondary | ICD-10-CM | POA: Insufficient documentation

## 2017-11-22 DIAGNOSIS — R109 Unspecified abdominal pain: Secondary | ICD-10-CM

## 2017-11-22 DIAGNOSIS — J45909 Unspecified asthma, uncomplicated: Secondary | ICD-10-CM | POA: Diagnosis not present

## 2017-11-22 DIAGNOSIS — F17200 Nicotine dependence, unspecified, uncomplicated: Secondary | ICD-10-CM | POA: Diagnosis not present

## 2017-11-22 LAB — LIPASE, BLOOD: LIPASE: 30 U/L (ref 11–51)

## 2017-11-22 LAB — COMPREHENSIVE METABOLIC PANEL
ALT: 25 U/L (ref 14–54)
ANION GAP: 7 (ref 5–15)
AST: 22 U/L (ref 15–41)
Albumin: 4.2 g/dL (ref 3.5–5.0)
Alkaline Phosphatase: 74 U/L (ref 38–126)
BUN: 12 mg/dL (ref 6–20)
CHLORIDE: 101 mmol/L (ref 101–111)
CO2: 29 mmol/L (ref 22–32)
Calcium: 9.3 mg/dL (ref 8.9–10.3)
Creatinine, Ser: 0.96 mg/dL (ref 0.44–1.00)
Glucose, Bld: 97 mg/dL (ref 65–99)
POTASSIUM: 3.5 mmol/L (ref 3.5–5.1)
Sodium: 137 mmol/L (ref 135–145)
Total Bilirubin: 0.4 mg/dL (ref 0.3–1.2)
Total Protein: 7.8 g/dL (ref 6.5–8.1)

## 2017-11-22 LAB — URINALYSIS, COMPLETE (UACMP) WITH MICROSCOPIC
BACTERIA UA: NONE SEEN
BILIRUBIN URINE: NEGATIVE
Glucose, UA: NEGATIVE mg/dL
HGB URINE DIPSTICK: NEGATIVE
KETONES UR: NEGATIVE mg/dL
LEUKOCYTES UA: NEGATIVE
NITRITE: NEGATIVE
PH: 6 (ref 5.0–8.0)
Protein, ur: NEGATIVE mg/dL
Specific Gravity, Urine: 1.019 (ref 1.005–1.030)

## 2017-11-22 LAB — CBC
HEMATOCRIT: 38.5 % (ref 35.0–47.0)
Hemoglobin: 12.8 g/dL (ref 12.0–16.0)
MCH: 25.6 pg — ABNORMAL LOW (ref 26.0–34.0)
MCHC: 33.2 g/dL (ref 32.0–36.0)
MCV: 77 fL — AB (ref 80.0–100.0)
Platelets: 344 10*3/uL (ref 150–440)
RBC: 5 MIL/uL (ref 3.80–5.20)
RDW: 15.2 % — ABNORMAL HIGH (ref 11.5–14.5)
WBC: 8.2 10*3/uL (ref 3.6–11.0)

## 2017-11-22 LAB — PREGNANCY, URINE: PREG TEST UR: NEGATIVE

## 2017-11-22 MED ORDER — METOCLOPRAMIDE HCL 5 MG/ML IJ SOLN
10.00 | INTRAMUSCULAR | Status: DC
Start: 2017-11-20 — End: 2017-11-22

## 2017-11-22 MED ORDER — ONDANSETRON HCL 4 MG/2ML IJ SOLN
4.00 | INTRAMUSCULAR | Status: DC
Start: ? — End: 2017-11-22

## 2017-11-22 MED ORDER — GI COCKTAIL ~~LOC~~
30.0000 mL | Freq: Once | ORAL | Status: AC
  Administered 2017-11-22: 30 mL via ORAL
  Filled 2017-11-22: qty 30

## 2017-11-22 MED ORDER — ACETAMINOPHEN 325 MG PO TABS
650.00 | ORAL_TABLET | ORAL | Status: DC
Start: ? — End: 2017-11-22

## 2017-11-22 MED ORDER — PROMETHAZINE HCL 25 MG RE SUPP: 25.0000 mg | Freq: Four times a day (QID) | RECTAL | 1 refills | Status: DC | PRN

## 2017-11-22 MED ORDER — SUCRALFATE 1 G PO TABS
1.0000 g | ORAL_TABLET | Freq: Once | ORAL | Status: AC
  Administered 2017-11-22: 1 g via ORAL
  Filled 2017-11-22 (×2): qty 1

## 2017-11-22 MED ORDER — SUCRALFATE 1 GM/10ML PO SUSP: 1.0000 g | Freq: Four times a day (QID) | ORAL | 1 refills | Status: DC

## 2017-11-22 NOTE — ED Provider Notes (Addendum)
[facility] Emergency Department Provider Note  ____________________________________________   I have reviewed the triage vital signs and the nursing notes. Where available I have reviewed prior notes and, if possible and indicated, outside hospital notes.    HISTORY  Chief Complaint Abdominal Pain    HPI Joann Miller is a 22 y.o. female with a history of asthma, migraines, obesity, chronic pain in her leg, recurrent syncope, Presents today complaining of epigastric abdominal pain worse with food there for a week no vomiting, she went to [facility] the records I am able to review, they found no significant pathology blood work was very reassuring, patient had a negative HIDA scan negative gallbladder scan and negative CT scan 2 days ago for the same pain.  They did determine that she was likely constipated, they started her on MiraLAX has been having satisfying bowel movements but still has abdominal pain in the epigastric region only.  No lower abdominal pain no vaginal discharge.  She states it is worse with food.  However she is able to eat food with no difficulty but it makes her nauseated.  She is not actually vomiting.  She denies melena or bright red blood per rectum or hematemesis.  Is a constant aching discomfort.  He denies being under increased stress.  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    Past Medical History:  Diagnosis Date  . Asthma   . Migraine   . Murmur   . Obesity   . Sciatic leg pain   . Syncope   . Tachycardia     There are no active problems to display for this patient.   Past Surgical History:  Procedure Laterality Date  . FOREIGN BODY REMOVAL EAR Right 05/10/2015   Procedure: REMOVAL FOREIGN BODY EAR;  Surgeon: Vernie Murders, MD;  Location: [facility] ORS;  Service: ENT;  Laterality: Right;  . KELOID EXCISION    . MINOR EXCISION EAR CANAL MASS Right 05/10/2015   Procedure: MINOR  EXCISION EAR CANAL MASS/EXCISION KELOID AND ADVANCEMENT FLAP REPAIR;  Surgeon: Vernie Murders, MD;  Location: [facility] ORS;  Service: ENT;  Laterality: Right;    Prior to Admission medications   Medication Sig Start Date End Date Taking? Authorizing Provider  amitriptyline (ELAVIL) 100 MG tablet Take 100 mg by mouth at bedtime.    [provider]  benzonatate (TESSALON PERLES) 100 MG capsule Take 1 capsule (100 mg total) by mouth every 6 (six) hours as needed for cough. 01/14/17 01/14/18  Schaevitz, Myra Rude, MD  cyclobenzaprine (FLEXERIL) 10 MG tablet Take 1 tablet (10 mg total) by mouth 3 (three) times daily as needed for muscle spasms. 07/22/16   Governor Rooks, MD  doxycycline (VIBRAMYCIN) 100 MG capsule Take 1 capsule (100 mg total) by mouth 2 (two) times daily. 01/14/17   Myrna Blazer, MD  Fluticasone-Salmeterol (ADVAIR) 500-50 MCG/DOSE AEPB Inhale 1 puff into the lungs 2 (two) times daily.    [provider]  ibuprofen (ADVIL,MOTRIN) 800 MG tablet Take 1 tablet (800 mg total) by mouth every 8 (eight) hours as needed. 07/22/16   Governor Rooks, MD  loratadine (CLARITIN) 10 MG tablet Take 10 mg by mouth daily.    [provider]  mometasone (ELOCON) 0.1 % cream Apply 1 application topically daily.    [provider]  montelukast (SINGULAIR) 10 MG tablet Take 10 mg by mouth at bedtime.    [provider]  naproxen (NAPROSYN) 500 MG  tablet Take 1 tablet (500 mg total) by mouth 2 (two) times daily with a meal. 04/20/16   Lutricia Feil, PA-C  ondansetron (ZOFRAN ODT) 4 MG disintegrating tablet Take 1 tablet (4 mg total) by mouth every 8 (eight) hours as needed for nausea or vomiting. 08/20/16   Minna Antis, MD  ondansetron (ZOFRAN) 4 MG tablet Take 1 tablet (4 mg total) by mouth every 8 (eight) hours as needed for nausea or vomiting. 07/22/16   Governor Rooks, MD  predniSONE (DELTASONE) 50 MG tablet Take 1 tab PO daily x5 days 01/14/17    Myrna Blazer, MD    Allergies Shellfish allergy and Penicillins  No family history on file.  Social History Social History   Tobacco Use  . Smoking status: Current Some Day Smoker    Packs/day: 0.20  . Smokeless tobacco: Never Used  Substance Use Topics  . Alcohol use: Yes    Comment: occasionally  . Drug use: No    Review of Systems Constitutional: No fever/chills Eyes: No visual changes. ENT: No sore throat. No stiff neck no neck pain Cardiovascular: Denies chest pain. Respiratory: Denies shortness of breath. Gastrointestinal:   no vomiting.  No diarrhea.  No constipation. Genitourinary: Negative for dysuria. Musculoskeletal: Negative lower extremity swelling Skin: Negative for rash. Neurological: Negative for severe headaches, focal weakness or numbness.   ____________________________________________   PHYSICAL EXAM:  VITAL SIGNS: ED Triage Vitals [11/22/17 1703]  Enc Vitals Group     BP 129/80     Pulse Rate (!) 109     Resp (!) 22     Temp 98.5 F (36.9 C)     Temp Source Oral     SpO2 97 %     Weight (!) 370 lb (167.8 kg)     Height 5\' 5"  (1.651 m)     Head Circumference      Peak Flow      Pain Score 8     Pain Loc      Pain Edu?      Excl. in GC?     Constitutional: Alert and oriented. Well appearing and in no acute distress.  Patient is making moaning noises plants of the room but as soon as I start talking to her she stops doing it.  She has no evidence of distress otherwise noted Eyes: Conjunctivae are normal Head: Atraumatic HEENT: No congestion/rhinnorhea. Mucous membranes are moist.  Oropharynx non-erythematous Neck:   Nontender with no meningismus, no masses, no stridor Cardiovascular: Normal rate, regular rhythm. Grossly normal heart sounds.  Good peripheral circulation. Respiratory: Normal respiratory effort.  No retractions. Lungs CTAB. Abdominal: Soft and positive epigastric discomfort,. No distention. No guarding no  rebound distractible exam. Back:  There is no focal tenderness or step off.  there is no midline tenderness there are no lesions noted. there is no CVA tenderness Musculoskeletal: No lower extremity tenderness, no upper extremity tenderness. No joint effusions, no DVT signs strong distal pulses no edema Neurologic:  Normal speech and language. No gross focal neurologic deficits are appreciated.  Skin:  Skin is warm, dry and intact. No rash noted. Psychiatric: Mood and affect are normal. Speech and behavior are normal.  ____________________________________________   LABS (all labs ordered are listed, but only abnormal results are displayed)  Labs Reviewed  CBC - Abnormal; Notable for the following components:      Result Value   MCV 77.0 (*)    MCH 25.6 (*)    RDW 15.2 (*)  All other components within normal limits  URINALYSIS, COMPLETE (UACMP) WITH MICROSCOPIC - Abnormal; Notable for the following components:   Color, Urine YELLOW (*)    APPearance CLEAR (*)    All other components within normal limits  LIPASE, BLOOD  COMPREHENSIVE METABOLIC PANEL  POC URINE PREG, ED    Pertinent labs  results that were available during my care of the patient were reviewed by me and considered in my medical decision making (see chart for details). ____________________________________________  EKG  I personally interpreted any EKGs ordered by me or triage EKG shows normal sinus rhythm, rate 92 bpm, borderline long QT, no acute ischemic changes, flipped T waves diffusely noted which are old from prior. ____________________________________________  RADIOLOGY  Pertinent labs & imaging results that were available during my care of the patient were reviewed by me and considered in my medical decision making (see chart for details). If possible, patient and/or family made aware of any abnormal findings.  No results found. ____________________________________________     PROCEDURES  Procedure(s) performed: None  Procedures  Critical Care performed: None  ____________________________________________   INITIAL IMPRESSION / ASSESSMENT AND PLAN / ED COURSE  Pertinent labs & imaging results that were available during my care of the patient were reviewed by me and considered in my medical decision making (see chart for details).  Patient likely will be a challenge, she has had abdominal pain for a week with extensive work-up including HIDA scan ultrasound gallbladder and CT scan all of which show no acute pathology in the last 2 days, the pain persist.  She does have an outpatient follow-up appointment with GI.  She is able to eat and drink, his epigastric pain is in the lower abdominal pain, nothing to suggest appendicitis and again had a negative CT scan.  Nothing to suggest PID, is food related epigastric discomfort.  She would prefer not to have a pelvic exam.  We will try Carafate and GI cocktail to see if that helps her.  I do not think this represents referred cardiac discomfort.  Not I think this represents PE is very reproducible abdominal pain worse with food.  ----------------------------------------- 8:02 PM on 11/22/2017 -----------------------------------------  Patient feels much better after GI cocktail and Carafate, EKG shows no acute change from prior at her age of 22 we have very large number of EKGs to compare with unfortunately,  At this time, there does not appear to be clinical evidence to support the diagnosis of pulmonary embolus, dissection, myocarditis, endocarditis, pericarditis, pericardial tamponade, acute coronary syndrome, pneumothorax, pneumonia, or any other acute intrathoracic pathology that will require admission or acute intervention. Nor is there evidence of any significant intra-abdominal pathology causing this discomfort.  Considering the patient's symptoms, medical history, and physical examination today, I have low  suspicion for cholecystitis or biliary pathology, pancreatitis, perforation or bowel obstruction, hernia, intra-abdominal abscess, AAA or dissection, volvulus or intussusception, mesenteric ischemia, ischemic gut, pyelonephritis or appendicitis.   ____________________________________________   FINAL CLINICAL IMPRESSION(S) / ED DIAGNOSES  Final diagnoses:  None      This chart was dictated using voice recognition software.  Despite best efforts to proofread,  errors can occur which can change meaning.      Jeanmarie PlantMcShane, Chenae Brager A, MD 11/22/17 1817    Jeanmarie PlantMcShane, Birdella Sippel A, MD 11/22/17 Kristopher [id number]    Jeanmarie PlantMcShane, Lyvonne Cassell A, MD 11/22/17 2003

## 2017-11-22 NOTE — ED Notes (Signed)
Topez not working 

## 2017-11-22 NOTE — ED Triage Notes (Addendum)
Pt comes into the ED via POV c/o RUQ abdominal pain.  Patient states [facility] to r/o gallbladder.  Patient states they did not see anything, but she is still having the pain and it is worse after eating.  Patient described the pain as constant and sharp.  Patient Having Nausea associated with the pain.  Patient in NAD at this time other than moaning in the triage room. Patient states that the pain is worse with a deep breath and the pain is under the right side of the rib cage.

## 2017-11-26 ENCOUNTER — Other Ambulatory Visit: Payer: Self-pay

## 2017-11-26 ENCOUNTER — Other Ambulatory Visit
Admission: RE | Admit: 2017-11-26 | Discharge: 2017-11-26 | Disposition: A | Discharge status: Home / Self Care | Payer: BLUE CROSS/BLUE SHIELD | Source: Ambulatory Visit | Attending: Gastroenterology | Admitting: Gastroenterology

## 2017-11-26 ENCOUNTER — Encounter: Payer: Self-pay | Admitting: Gastroenterology

## 2017-11-26 ENCOUNTER — Ambulatory Visit (INDEPENDENT_AMBULATORY_CARE_PROVIDER_SITE_OTHER): Payer: BLUE CROSS/BLUE SHIELD | Admitting: Gastroenterology

## 2017-11-26 VITALS — BP 125/86 | HR 105 | Wt 362.0 lb

## 2017-11-26 DIAGNOSIS — F445 Conversion disorder with seizures or convulsions: Secondary | ICD-10-CM | POA: Insufficient documentation

## 2017-11-26 DIAGNOSIS — K2100 Gastro-esophageal reflux disease with esophagitis, without bleeding: Secondary | ICD-10-CM

## 2017-11-26 DIAGNOSIS — R1013 Epigastric pain: Secondary | ICD-10-CM

## 2017-11-26 DIAGNOSIS — Z791 Long term (current) use of non-steroidal anti-inflammatories (NSAID): Secondary | ICD-10-CM | POA: Diagnosis not present

## 2017-11-26 DIAGNOSIS — D509 Iron deficiency anemia, unspecified: Secondary | ICD-10-CM | POA: Insufficient documentation

## 2017-11-26 LAB — IRON AND TIBC
IRON: 49 ug/dL (ref 28–170)
Saturation Ratios: 12 % (ref 10.4–31.8)
TIBC: 397 ug/dL (ref 250–450)
UIBC: 348 ug/dL

## 2017-11-26 LAB — FERRITIN: Ferritin: 33 ng/mL (ref 11–307)

## 2017-11-26 LAB — VITAMIN B12: VITAMIN B 12: 197 pg/mL (ref 180–914)

## 2017-11-26 LAB — FOLATE: Folate: 6.9 ng/mL (ref >5.9)

## 2017-11-26 NOTE — Progress Notes (Signed)
Arlyss Repress, MD [address]  [address]  [place], [place] [address]  Main: [phone]  Fax: [phone]    Gastroenterology Consultation  Referring Provider:     [facility] Primary Care Physician:  [facility] Primary Gastroenterologist:  Dr. Arlyss Repress Reason for Consultation:     Epigastric and right upper quadrant pain        HPI:   Joann Miller is a 22 y.o. female with morbid obesity referred by Dr. Dan Humphreys, [facility]  for consultation & management of epigastric and right upper quadrant pain. Onset of pain about 2-3 weeks ago, constant, sharp and associated with nausea and worse when lying on right side. He took Pepto-Bismol and noticed black pebble-like stools. Otherwise, reports having regular bowel movements without constipation or diarrhea. She went to ER last week due to worsening of symptoms She has mild microcytosis without anemia. Lipase and CMP are unremarkable.Right upper quadrant ultrasound revealed fatty liver. No evidence of cholelithiasis. She denies nausea, vomiting, eight loss or loss of appetite, denies early satiety. She denies smoking cigarettes or heavy alcohol use She is not on proton pump inhibitor  NSAIDs: ibuprofen 200 mg 4-5 pills daily for several years secondary to chronic joint pains, also uses BC powders intermittently  Antiplts/Anticoagulants/Anti thrombotics: none  GI Procedures: none She denies family history of GI malignancy  Past Medical History:  Diagnosis Date  . Asthma   . Migraine   . Murmur   . Obesity   . Sciatic leg pain   . Syncope   . Tachycardia     Past Surgical History:  Procedure Laterality Date  . FOREIGN BODY REMOVAL EAR Right 05/10/2015   Procedure: REMOVAL FOREIGN BODY EAR;  Surgeon: Vernie Murders, MD;  Location: [facility] ORS;  Service: ENT;  Laterality: Right;  . KELOID EXCISION    . MINOR EXCISION EAR CANAL MASS Right 05/10/2015   Procedure: MINOR EXCISION EAR CANAL  MASS/EXCISION KELOID AND ADVANCEMENT FLAP REPAIR;  Surgeon: Vernie Murders, MD;  Location: [facility] ORS;  Service: ENT;  Laterality: Right;     Current Outpatient Medications:  .  albuterol (PROVENTIL HFA;VENTOLIN HFA) 108 (90 Base) MCG/ACT inhaler, Inhale 2 puffs into the lungs every 6 (six) hours as needed for wheezing or shortness of breath., Disp: , Rfl:  .  FLUoxetine (PROZAC) 20 MG capsule, Take by mouth., Disp: , Rfl:  .  Fluticasone-Salmeterol (ADVAIR DISKUS) 250-50 MCG/DOSE AEPB, Inhale into the lungs., Disp: , Rfl:  .  hydrochlorothiazide (HYDRODIURIL) 12.5 MG tablet, Take by mouth., Disp: , Rfl:  .  ipratropium (ATROVENT) 0.03 % nasal spray, Place into the nose., Disp: , Rfl:  .  medroxyPROGESTERone (DEPO-PROVERA) 150 MG/ML injection, INJECT 1 ML (150 MG TOTAL) INTO THE MUSCLE EVERY 3 (THREE) MONTHS, Disp: , Rfl: 3 .  metFORMIN (GLUCOPHAGE) 500 MG tablet, TAKE 1 TABLET BY MOUTH EVERY DAY WITH BREAKFAST, Disp: , Rfl: 11 .  montelukast (SINGULAIR) 10 MG tablet, Take 10 mg by mouth at bedtime., Disp: , Rfl:  .  nortriptyline (PAMELOR) 10 MG capsule, TAKE 1 CAPSULE BY MOUTH EVERY DAY IN THE EVENING, Disp: , Rfl: 11 .  sucralfate (CARAFATE) 1 GM/10ML suspension, Take 10 mLs (1 g total) by mouth 4 (four) times daily., Disp: 420 mL, Rfl: 1 .  promethazine (PHENERGAN) 25 MG suppository, Place 1 suppository (25 mg total) rectally every 6 (six) hours as needed for nausea. (Patient not taking: Reported on 11/26/2017), Disp: 12 suppository, Rfl:  1   History reviewed. No pertinent family history.   Social History   Tobacco Use  . Smoking status: Current Some Day Smoker    Packs/day: 0.20  . Smokeless tobacco: Never Used  Substance Use Topics  . Alcohol use: Yes    Comment: occasionally  . Drug use: No    Allergies as of 11/26/2017 - Review Complete 11/26/2017  Allergen Reaction Noted  . Atorvastatin  11/07/2017  . Peanut oil Anaphylaxis 11/07/2017  . Shellfish allergy Anaphylaxis  02/25/2016  . Cat hair extract  04/07/2016  . Pollen extract  04/07/2016  . Penicillins Rash 05/03/2015    Review of Systems:    All systems reviewed and negative except where noted in HPI.   Physical Exam:  BP 125/86 (BP Location: Left Arm, Patient Position: Sitting, Cuff Size: Large)   Pulse (!) 105   Wt (!) 362 lb (164.2 kg)   LMP 11/22/2017 (LMP Unknown)   BMI 60.24 kg/m  Patient's last menstrual period was 11/22/2017 (lmp unknown).  General:   Alert, Obese, Well-developed, well-nourished, pleasant and cooperative in NAD Head:  Normocephalic and atraumatic. Eyes:  Sclera clear, no icterus.   Conjunctiva pink. Ears:  Normal auditory acuity. Nose:  No deformity, discharge, or lesions. Mouth:  No deformity or lesions,oropharynx pink & moist. Neck:  Supple; no masses or thyromegaly. Lungs:  Respirations even and unlabored.  Clear throughout to auscultation.   No wheezes, crackles, or rhonchi. No acute distress. Heart:  Regular rate and rhythm; no murmurs, clicks, rubs, or gallops. Abdomen:  Normal bowel sounds. Soft, mild tenderness in right upper quadrant and epigastric region, limited abdominal exam given her body habitus.  No guarding or rebound tenderness.   Rectal: Not performed Msk:  Symmetrical without gross deformities. Good, equal movement & strength bilaterally. Pulses:  Normal pulses noted. Extremities:  No clubbing or edema.  No cyanosis. Neurologic:  Alert and oriented x3;  grossly normal neurologically. Skin:  Intact without significant lesions or rashes. No jaundice. Psych:  Alert and cooperative. Normal mood and affect.  Imaging Studies: reviewed  Assessment and Plan:   Joann Miller is a 22 y.o. African-American female with morbid obesity, hypertension presents with 2-3 weeks of epigastric and right upper quadrant pain associated with nausea and heavy NSAID use for chronic joint pains  - Minimize intake of NSAIDs use - start omeprazole 20 mg daily - EGD  for further evaluation to rule out peptic ulcer disease - check iron studies, B12 and folate levels for workup of microcytic anemia   Follow up in 4 weeks   Arlyss Repress, MD

## 2017-11-27 ENCOUNTER — Encounter: Payer: Self-pay | Admitting: *Deleted

## 2017-11-27 ENCOUNTER — Encounter: Payer: Self-pay | Admitting: Gastroenterology

## 2017-11-27 ENCOUNTER — Encounter
Admission: RE | Disposition: A | Discharge status: Home / Self Care | Payer: Self-pay | Source: Ambulatory Visit | Attending: Gastroenterology

## 2017-11-27 ENCOUNTER — Ambulatory Visit: Payer: BLUE CROSS/BLUE SHIELD | Admitting: Certified Registered Nurse Anesthetist

## 2017-11-27 ENCOUNTER — Ambulatory Visit
Admission: RE | Admit: 2017-11-27 | Discharge: 2017-11-27 | Disposition: A | Discharge status: Home / Self Care | Payer: BLUE CROSS/BLUE SHIELD | Source: Ambulatory Visit | Attending: Gastroenterology | Admitting: Gastroenterology

## 2017-11-27 DIAGNOSIS — K2100 Gastro-esophageal reflux disease with esophagitis, without bleeding: Secondary | ICD-10-CM

## 2017-11-27 DIAGNOSIS — J45909 Unspecified asthma, uncomplicated: Secondary | ICD-10-CM | POA: Insufficient documentation

## 2017-11-27 DIAGNOSIS — Z6841 Body Mass Index (BMI) 40.0 and over, adult: Secondary | ICD-10-CM | POA: Insufficient documentation

## 2017-11-27 DIAGNOSIS — K449 Diaphragmatic hernia without obstruction or gangrene: Secondary | ICD-10-CM | POA: Diagnosis not present

## 2017-11-27 DIAGNOSIS — G4733 Obstructive sleep apnea (adult) (pediatric): Secondary | ICD-10-CM | POA: Diagnosis not present

## 2017-11-27 DIAGNOSIS — Z79899 Other long term (current) drug therapy: Secondary | ICD-10-CM | POA: Diagnosis not present

## 2017-11-27 DIAGNOSIS — F17210 Nicotine dependence, cigarettes, uncomplicated: Secondary | ICD-10-CM | POA: Diagnosis not present

## 2017-11-27 DIAGNOSIS — Z91010 Allergy to peanuts: Secondary | ICD-10-CM | POA: Insufficient documentation

## 2017-11-27 DIAGNOSIS — R011 Cardiac murmur, unspecified: Secondary | ICD-10-CM | POA: Insufficient documentation

## 2017-11-27 DIAGNOSIS — Z7984 Long term (current) use of oral hypoglycemic drugs: Secondary | ICD-10-CM | POA: Insufficient documentation

## 2017-11-27 DIAGNOSIS — R1013 Epigastric pain: Secondary | ICD-10-CM | POA: Diagnosis not present

## 2017-11-27 DIAGNOSIS — Z9989 Dependence on other enabling machines and devices: Secondary | ICD-10-CM | POA: Insufficient documentation

## 2017-11-27 DIAGNOSIS — Z888 Allergy status to other drugs, medicaments and biological substances status: Secondary | ICD-10-CM | POA: Diagnosis not present

## 2017-11-27 DIAGNOSIS — F329 Major depressive disorder, single episode, unspecified: Secondary | ICD-10-CM | POA: Insufficient documentation

## 2017-11-27 DIAGNOSIS — Z880 Allergy status to penicillin: Secondary | ICD-10-CM | POA: Diagnosis not present

## 2017-11-27 DIAGNOSIS — Z91013 Allergy to seafood: Secondary | ICD-10-CM | POA: Diagnosis not present

## 2017-11-27 HISTORY — PX: ESOPHAGOGASTRODUODENOSCOPY (EGD) WITH PROPOFOL: SHX5813

## 2017-11-27 LAB — POCT PREGNANCY, URINE: Preg Test, Ur: NEGATIVE

## 2017-11-27 SURGERY — ESOPHAGOGASTRODUODENOSCOPY (EGD) WITH PROPOFOL
Anesthesia: General

## 2017-11-27 MED ORDER — PROPOFOL 500 MG/50ML IV EMUL
INTRAVENOUS | Status: AC
  Filled 2017-11-27: qty 50

## 2017-11-27 MED ORDER — MIDAZOLAM HCL 2 MG/2ML IJ SOLN
INTRAMUSCULAR | Status: AC
  Filled 2017-11-27: qty 2

## 2017-11-27 MED ORDER — KETAMINE HCL 50 MG/ML IJ SOLN
INTRAMUSCULAR | Status: AC
  Filled 2017-11-27: qty 10

## 2017-11-27 MED ORDER — PROPOFOL 10 MG/ML IV BOLUS
INTRAVENOUS | Status: DC | PRN
  Administered 2017-11-27: 20 mg via INTRAVENOUS
  Administered 2017-11-27 (×2): 30 mg via INTRAVENOUS
  Administered 2017-11-27: 50 mg via INTRAVENOUS
  Administered 2017-11-27: 20 mg via INTRAVENOUS

## 2017-11-27 MED ORDER — KETAMINE HCL 10 MG/ML IJ SOLN
INTRAMUSCULAR | Status: DC | PRN
  Administered 2017-11-27: 30 mg via INTRAVENOUS

## 2017-11-27 MED ORDER — OMEPRAZOLE 20 MG PO CPDR: 20.0000 mg | DELAYED_RELEASE_CAPSULE | Freq: Two times a day (BID) | ORAL | 2 refills | Status: DC

## 2017-11-27 MED ORDER — SODIUM CHLORIDE 0.9 % IV SOLN
INTRAVENOUS | Status: DC
  Administered 2017-11-27: 14:00:00 via INTRAVENOUS

## 2017-11-27 MED ORDER — MIDAZOLAM HCL 2 MG/2ML IJ SOLN
INTRAMUSCULAR | Status: DC | PRN
  Administered 2017-11-27: 2 mg via INTRAVENOUS

## 2017-11-27 NOTE — H&P (Signed)
Arlyss Repress, MD [address]  [address]  [place], [place] [address]  Main: [phone]  Fax: [phone] Pager: [phone]  Primary Care Physician:  [facility] Primary Gastroenterologist:  Dr. Arlyss Repress  Pre-Procedure History & Physical: HPI:  Joann Miller is a 22 y.o. female is here for an endoscopy.   Past Medical History:  Diagnosis Date  . Asthma   . Migraine   . Murmur   . Obesity   . Sciatic leg pain   . Syncope   . Tachycardia     Past Surgical History:  Procedure Laterality Date  . FOREIGN BODY REMOVAL EAR Right 05/10/2015   Procedure: REMOVAL FOREIGN BODY EAR;  Surgeon: Vernie Murders, MD;  Location: [facility] ORS;  Service: ENT;  Laterality: Right;  . KELOID EXCISION    . MINOR EXCISION EAR CANAL MASS Right 05/10/2015   Procedure: MINOR EXCISION EAR CANAL MASS/EXCISION KELOID AND ADVANCEMENT FLAP REPAIR;  Surgeon: Vernie Murders, MD;  Location: [facility] ORS;  Service: ENT;  Laterality: Right;    Prior to Admission medications   Medication Sig Start Date End Date Taking? Authorizing Provider  albuterol (PROVENTIL HFA;VENTOLIN HFA) 108 (90 Base) MCG/ACT inhaler Inhale 2 puffs into the lungs every 6 (six) hours as needed for wheezing or shortness of breath.    [provider]  FLUoxetine (PROZAC) 20 MG capsule Take by mouth. 03/25/14   [provider]  Fluticasone-Salmeterol (ADVAIR DISKUS) 250-50 MCG/DOSE AEPB Inhale into the lungs.    [provider]  hydrochlorothiazide (HYDRODIURIL) 12.5 MG tablet Take by mouth. 04/10/14   [provider]  ipratropium (ATROVENT) 0.03 % nasal spray Place into the nose. 11/07/17 11/07/18  [provider]  medroxyPROGESTERone (DEPO-PROVERA) 150 MG/ML injection INJECT 1 ML (150 MG TOTAL) INTO THE MUSCLE EVERY 3 (THREE) MONTHS 08/27/17   [provider]  metFORMIN (GLUCOPHAGE) 500 MG tablet TAKE 1 TABLET BY MOUTH EVERY DAY WITH BREAKFAST 11/07/17   [provider]  montelukast (SINGULAIR) 10 MG tablet Take 10 mg by mouth at bedtime.    [provider]  nortriptyline (PAMELOR) 10 MG capsule TAKE 1 CAPSULE BY MOUTH EVERY DAY IN THE EVENING 11/07/17   [provider]  promethazine (PHENERGAN) 25 MG suppository Place 1 suppository (25 mg total) rectally every 6 (six) hours as needed for nausea. Patient not taking: Reported on 11/26/2017 11/22/17 11/22/18  Jeanmarie Plant, MD  sucralfate (CARAFATE) 1 GM/10ML suspension Take 10 mLs (1 g total) by mouth 4 (four) times daily. 11/22/17 11/22/18  Jeanmarie Plant, MD    Allergies as of 11/26/2017 - Review Complete 11/26/2017  Allergen Reaction Noted  . Atorvastatin  11/07/2017  . Peanut oil Anaphylaxis 11/07/2017  . Shellfish allergy Anaphylaxis 02/25/2016  . Cat hair extract  04/07/2016  . Pollen extract  04/07/2016  . Penicillins Rash 05/03/2015    History reviewed. No pertinent family history.  Social History   Socioeconomic History  . Marital status: Single    Spouse name: Not on file  . Number of children: Not on file  . Years of education: Not on file  . Highest education level: Not on file  Occupational History  . Not on file  Social Needs  . Financial resource strain: Not on file  . Food insecurity:    Worry: Not on file    Inability: Not on file  . Transportation needs:    Medical: Not on file    Non-medical: Not on file  Tobacco Use  . Smoking status: Current Some Day Smoker    Packs/day: 0.20  . Smokeless tobacco: Never Used  Substance and Sexual Activity  . Alcohol use: Yes    Comment: occasionally  . Drug use: No  . Sexual activity: Not on file  Lifestyle  . Physical activity:    Days per week: Not on file    Minutes per session: Not on file  . Stress: Not on file  Relationships  . Social connections:    Talks on phone: Not on file    Gets together: Not on file    Attends religious service: Not on file    Active member of club or organization: Not on  file    Attends meetings of clubs or organizations: Not on file    Relationship status: Not on file  . Intimate partner violence:    Fear of current or ex partner: Not on file    Emotionally abused: Not on file    Physically abused: Not on file    Forced sexual activity: Not on file  Other Topics Concern  . Not on file  Social History Narrative  . Not on file    Review of Systems: See HPI, otherwise negative ROS  Physical Exam: BP (!) 146/107   Pulse 95   Temp (!) 97.5 F (36.4 C) (Tympanic)   Resp 20   Ht  (1.651 m)   Wt (!) 370 lb (167.8 kg)   LMP 11/22/2017 (LMP Unknown) Comment: Pregnancy test: Negative   SpO2 100%   BMI 61.57 kg/m  General:   Alert,  pleasant and cooperative in NAD Head:  Normocephalic and atraumatic. Neck:  Supple; no masses or thyromegaly. Lungs:  Clear throughout to auscultation.    Heart:  Regular rate and rhythm. Abdomen:  Soft, nontender and nondistended. Normal bowel sounds, without guarding, and without rebound.   Neurologic:  Alert and  oriented x4;  grossly normal neurologically.  Impression/Plan: Joann Miller is here for an endoscopy to be performed for dyspepsia  Risks, benefits, limitations, and alternatives regarding  endoscopy have been reviewed with the patient.  Questions have been answered.  All parties agreeable.   Lannette Donath, MD  11/27/2017, 1:51 PM

## 2017-11-27 NOTE — Anesthesia Procedure Notes (Signed)
Procedure Name: MAC Date/Time: 11/27/2017 2:10 PM Performed by: Rudean Hitt, CRNA Pre-anesthesia Checklist: Patient identified, Emergency Drugs available, Suction available, Patient being monitored and Timeout performed Patient Re-evaluated:Patient Re-evaluated prior to induction Oxygen Delivery Method: Simple face mask

## 2017-11-27 NOTE — Op Note (Signed)
[facility] Gastroenterology Patient Name: Joann Miller Procedure Date: 11/27/2017 2:05 PM MRN: [id number] Account #: [id number] Date of Birth: 06/26/1996 Admit Type: Outpatient Age: 22 Room: [facility] ENDO ROOM 4 Gender: Female Note Status: Finalized Procedure:            Upper GI endoscopy Indications:          Dyspepsia Providers:            Lin Landsman MD, MD Referring MD:         Rubbie Battiest. Iona Beard MD, MD (Referring MD) Medicines:            Monitored Anesthesia Care Complications:        No immediate complications. Estimated blood loss:                        Minimal. Procedure:            Pre-Anesthesia Assessment:                       - Prior to the procedure, a History and Physical was                        performed, and patient medications and allergies were                        reviewed. The patient is competent. The risks and                        benefits of the procedure and the sedation options and                        risks were discussed with the patient. All questions                        were answered and informed consent was obtained.                        Patient identification and proposed procedure were                        verified by the physician, the nurse, the                        anesthesiologist, the anesthetist and the technician in                        the pre-procedure area in the procedure room in the                        endoscopy suite. Mental Status Examination: alert and                        oriented. Airway Examination: normal oropharyngeal                        airway and neck mobility. Respiratory Examination:                        clear to auscultation. CV Examination: normal.  Prophylactic Antibiotics: The patient does not require                        prophylactic antibiotics. Prior Anticoagulants: The                        patient has taken no previous anticoagulant or                      antiplatelet agents. ASA Grade Assessment: III - A                        patient with severe systemic disease. After reviewing                        the risks and benefits, the patient was deemed in                        satisfactory condition to undergo the procedure. The                        anesthesia plan was to use monitored anesthesia care                        (MAC). Immediately prior to administration of                        medications, the patient was re-assessed for adequacy                        to receive sedatives. The heart rate, respiratory rate,                        oxygen saturations, blood pressure, adequacy of                        pulmonary ventilation, and response to care were                        monitored throughout the procedure. The physical status                        of the patient was re-assessed after the procedure.                       After obtaining informed consent, the endoscope was                        passed under direct vision. Throughout the procedure,                        the patient's blood pressure, pulse, and oxygen                        saturations were monitored continuously. The Endoscope                        was introduced through the mouth, and advanced to the  second part of duodenum. The upper GI endoscopy was                        accomplished without difficulty. The patient tolerated                        the procedure fairly well. Findings:      The duodenal bulb and second portion of the duodenum were normal.      A medium-sized hiatal hernia was present.      The entire examined stomach was normal. Biopsies were taken with a cold       forceps for Helicobacter pylori testing.      The cardia and gastric fundus were normal on retroflexion.      LA Grade B (one or more mucosal breaks greater than 5 mm, not extending       between the tops of two mucosal folds)  esophagitis with no bleeding was       found in the lower third of the esophagus. Impression:           - Normal duodenal bulb and second portion of the                        duodenum.                       - Medium-sized hiatal hernia.                       - Normal stomach. Biopsied.                       - LA Grade B reflux esophagitis. Recommendation:       - Await pathology results.                       - Discharge patient to home.                       - Resume previous diet today.                       - Continue present medications.                       - Follow an antireflux regimen.                       - Use a proton pump inhibitor PO BID for 3 months.                       - Return to my office as previously scheduled. Procedure Code(s):    --- Professional ---                       [phone], Esophagogastroduodenoscopy, flexible, transoral;                        with biopsy, single or multiple Diagnosis Code(s):    --- Professional ---                       K44.9, Diaphragmatic hernia without obstruction or  gangrene                       K21.0, Gastro-esophageal reflux disease with esophagitis                       R10.13, Epigastric pain CPT copyright 2017 American Medical Association. All rights reserved. The codes documented in this report are preliminary and upon coder review may  be revised to meet current compliance requirements. Dr. Ulyess Mort Lin Landsman MD, MD 11/27/2017 2:20:10 PM This report has been signed electronically. Number of Addenda: 0 Note Initiated On: 11/27/2017 2:05 PM      [facility]

## 2017-11-27 NOTE — Transfer of Care (Signed)
Immediate Anesthesia Transfer of Care Note  Patient: Joann Miller  Procedure(s) Performed: ESOPHAGOGASTRODUODENOSCOPY (EGD) WITH PROPOFOL (N/A )  Patient Location: PACU  Anesthesia Type:General  Level of Consciousness: drowsy  Airway & Oxygen Therapy: Patient Spontanous Breathing and Patient connected to nasal cannula oxygen  Post-op Assessment: Report given to RN and Post -op Vital signs reviewed and stable  Post vital signs: Reviewed and stable  Last Vitals:  Vitals Value Taken Time  BP 121/63 11/27/2017  2:26 PM  Temp    Pulse 106 11/27/2017  2:28 PM  Resp 25 11/27/2017  2:25 PM  SpO2 100 % 11/27/2017  2:28 PM  Vitals shown include unvalidated device data.  Last Pain:  Vitals:   11/27/17 1324  TempSrc: Tympanic  PainSc: 0-No pain         Complications: No apparent anesthesia complications

## 2017-11-27 NOTE — Anesthesia Preprocedure Evaluation (Addendum)
Anesthesia Evaluation  Patient identified by MRN, date of birth, ID band Patient awake    Reviewed: Allergy & Precautions, H&P , NPO status , reviewed documented beta blocker date and time   Airway Mallampati: II  TM Distance: >3 FB Neck ROM: full    Dental  (+) Teeth Intact   Pulmonary asthma , Current Smoker,    Pulmonary exam normal        Cardiovascular hypertension, Normal cardiovascular exam+ Valvular Problems/Murmurs   Hx murmur   Neuro/Psych  Headaches, Seizures -,  PSYCHIATRIC DISORDERS Depression  Neuromuscular disease    GI/Hepatic GERD  Medicated,  Endo/Other  diabetes  Renal/GU      Musculoskeletal   Abdominal   Peds  Hematology   Anesthesia Other Findings Asthma   Migraine  Murmur  Massive Morbid Obesity (BMI 61.5)  Sciatic leg pain  Syncope  Tachycardia  Counseled at length re: OSA and CPAP use, pt accepts responsibility    Reproductive/Obstetrics                            Anesthesia Physical Anesthesia Plan  ASA: III  Anesthesia Plan: General   Post-op Pain Management:    Induction:   PONV Risk Score and Plan: 3 and 2 and TIVA  Airway Management Planned:   Additional Equipment:   Intra-op Plan:   Post-operative Plan:   Informed Consent: I have reviewed the patients History and Physical, chart, labs and discussed the procedure including the risks, benefits and alternatives for the proposed anesthesia with the patient or authorized representative who has indicated his/her understanding and acceptance.   Dental Advisory Given  Plan Discussed with: CRNA  Anesthesia Plan Comments:        Anesthesia Quick Evaluation

## 2017-11-27 NOTE — Anesthesia Post-op Follow-up Note (Signed)
Anesthesia QCDR form completed.        

## 2017-11-28 NOTE — Anesthesia Postprocedure Evaluation (Signed)
Anesthesia Post Note  Patient: Joann Miller  Procedure(s) Performed: ESOPHAGOGASTRODUODENOSCOPY (EGD) WITH PROPOFOL (N/A )  Patient location during evaluation: Endoscopy Anesthesia Type: General Level of consciousness: awake and alert Pain management: pain level controlled Vital Signs Assessment: post-procedure vital signs reviewed and stable Respiratory status: spontaneous breathing, nonlabored ventilation and respiratory function stable Cardiovascular status: blood pressure returned to baseline and stable Postop Assessment: no apparent nausea or vomiting Anesthetic complications: no     Last Vitals:  Vitals:   11/27/17 1425 11/27/17 1455  BP: 121/63 140/76  Pulse:  92  Resp: 20 (!) 22  Temp:    SpO2:  100%    Last Pain:  Vitals:   11/27/17 1324  TempSrc: Tympanic  PainSc: 0-No pain                 Christia Reading

## 2017-11-29 ENCOUNTER — Encounter: Payer: Self-pay | Admitting: Gastroenterology

## 2017-11-30 LAB — SURGICAL PATHOLOGY

## 2017-12-08 DIAGNOSIS — E7841 Elevated Lipoprotein(a): Secondary | ICD-10-CM | POA: Insufficient documentation

## 2017-12-10 ENCOUNTER — Encounter: Payer: Self-pay | Admitting: Gastroenterology

## 2017-12-26 ENCOUNTER — Ambulatory Visit: Payer: Self-pay | Admitting: Gastroenterology

## 2017-12-26 ENCOUNTER — Ambulatory Visit: Payer: BLUE CROSS/BLUE SHIELD | Admitting: Gastroenterology

## 2017-12-31 ENCOUNTER — Ambulatory Visit (INDEPENDENT_AMBULATORY_CARE_PROVIDER_SITE_OTHER): Payer: BLUE CROSS/BLUE SHIELD | Admitting: Gastroenterology

## 2017-12-31 ENCOUNTER — Encounter: Payer: Self-pay | Admitting: Gastroenterology

## 2017-12-31 VITALS — BP 117/76 | HR 109 | Resp 18 | Wt 362.0 lb

## 2017-12-31 DIAGNOSIS — K2100 Gastro-esophageal reflux disease with esophagitis, without bleeding: Secondary | ICD-10-CM

## 2017-12-31 MED ORDER — OMEPRAZOLE 40 MG PO CPDR: 40.0000 mg | DELAYED_RELEASE_CAPSULE | Freq: Two times a day (BID) | ORAL | 0 refills | Status: AC

## 2017-12-31 NOTE — Progress Notes (Signed)
Arlyss Repress, MD [address]  [address]  [place], [place] [address]  Main: [phone]  Fax: [phone]    Gastroenterology Consultation  Referring Provider:     [facility] Primary Care Physician:  [facility] Primary Gastroenterologist:  Dr. Arlyss Repress Reason for Consultation:  dyspepsia, reflux esophagitis        HPI:   Joann Miller is a 22 y.o. female with morbid obesity referred by Dr. Dan Humphreys, [facility]  for consultation & management of epigastric and right upper quadrant pain. Onset of pain about 2-3 weeks ago, constant, sharp and associated with nausea and worse when lying on right side. She took Pepto-Bismol and noticed black pebble-like stools. Otherwise, reports having regular bowel movements without constipation or diarrhea. She went to ER last week due to worsening of symptoms She has mild microcytosis without anemia. Lipase and CMP are unremarkable.Right upper quadrant ultrasound revealed fatty liver. No evidence of cholelithiasis. She denies nausea, vomiting, eight loss or loss of appetite, denies early satiety. She denies smoking cigarettes or heavy alcohol use She is not on proton pump inhibitor  Follow-up visit 12/31/2017 She underwent EGD which revealed LA grade B reflux esophagitis. There is no evidence of H. Pylori. I started her on omeprazole 20 mg twice daily. She reports that her symptoms have improved but not completely resolved. She is working on healthy dietary habits. She has head of the bed elevated at bedtime and that is helping with heartburn.she still has mild epigastric discomfort. She is working with her psychiatrist for management of depression  NSAIDs: used to take ibuprofen 200 mg 4-5 pills daily for several years secondary to chronic joint pains, also uses BC powders intermittently. She stopped NSAID in 11/2017  Antiplts/Anticoagulants/Anti thrombotics: none  GI Procedures:  EGD 11/27/2017 - Normal  duodenal bulb and second portion of the duodenum. - Medium-sized hiatal hernia. - Normal stomach. Biopsied. - LA Grade B reflux esophagitis.  She denies family history of GI malignancy  Past Medical History:  Diagnosis Date  . Asthma   . Migraine   . Murmur   . Obesity   . Sciatic leg pain   . Syncope   . Tachycardia     Past Surgical History:  Procedure Laterality Date  . ESOPHAGOGASTRODUODENOSCOPY (EGD) WITH PROPOFOL N/A 11/27/2017   Procedure: ESOPHAGOGASTRODUODENOSCOPY (EGD) WITH PROPOFOL;  Surgeon: Toney Reil, MD;  Location: [facility] ENDOSCOPY;  Service: Gastroenterology;  Laterality: N/A;  . FOREIGN BODY REMOVAL EAR Right 05/10/2015   Procedure: REMOVAL FOREIGN BODY EAR;  Surgeon: Vernie Murders, MD;  Location: [facility] ORS;  Service: ENT;  Laterality: Right;  . KELOID EXCISION    . MINOR EXCISION EAR CANAL MASS Right 05/10/2015   Procedure: MINOR EXCISION EAR CANAL MASS/EXCISION KELOID AND ADVANCEMENT FLAP REPAIR;  Surgeon: Vernie Murders, MD;  Location: [facility] ORS;  Service: ENT;  Laterality: Right;     Current Outpatient Medications:  .  albuterol (PROVENTIL HFA;VENTOLIN HFA) 108 (90 Base) MCG/ACT inhaler, Inhale 2 puffs into the lungs every 6 (six) hours as needed for wheezing or shortness of breath., Disp: , Rfl:  .  DULoxetine (CYMBALTA) 30 MG capsule, Take by mouth., Disp: , Rfl:  .  Fluticasone-Salmeterol (ADVAIR DISKUS) 250-50 MCG/DOSE AEPB, Inhale into the lungs., Disp: , Rfl:  .  hydrochlorothiazide (HYDRODIURIL) 25 MG tablet, Take by mouth., Disp: , Rfl:  .  ipratropium (ATROVENT) 0.03 % nasal spray, Place into the nose., Disp: , Rfl:  .  metFORMIN (GLUCOPHAGE) 500 MG tablet, TAKE 1 TABLET BY MOUTH EVERY DAY WITH BREAKFAST, Disp: , Rfl: 11 .  montelukast (SINGULAIR) 10 MG tablet, Take 10 mg by mouth at bedtime., Disp: , Rfl:  .  Multiple Vitamin (MULTI-VITAMINS) TABS, Take by mouth., Disp: , Rfl:  .  nortriptyline (PAMELOR) 10 MG capsule, TAKE 1 CAPSULE BY MOUTH  EVERY DAY IN THE EVENING, Disp: , Rfl: 11 .  simvastatin (ZOCOR) 10 MG tablet, Take by mouth., Disp: , Rfl:  .  FLUoxetine (PROZAC) 20 MG capsule, Take by mouth., Disp: , Rfl:  .  medroxyPROGESTERone (DEPO-PROVERA) 150 MG/ML injection, INJECT 1 ML (150 MG TOTAL) INTO THE MUSCLE EVERY 3 (THREE) MONTHS, Disp: , Rfl: 3 .  omeprazole (PRILOSEC) 40 MG capsule, Take 1 capsule (40 mg total) by mouth 2 (two) times daily before a meal., Disp: 180 capsule, Rfl: 0   No family history on file.   Social History   Tobacco Use  . Smoking status: Current Some Day Smoker    Packs/day: 0.20  . Smokeless tobacco: Never Used  Substance Use Topics  . Alcohol use: Yes    Comment: occasionally  . Drug use: No    Allergies as of 12/31/2017 - Review Complete 12/31/2017  Allergen Reaction Noted  . Atorvastatin  11/07/2017  . Peanut oil Anaphylaxis 11/07/2017  . Shellfish allergy Anaphylaxis 02/25/2016  . Cat hair extract  04/07/2016  . Pollen extract  04/07/2016  . Penicillins Rash 05/03/2015    Review of Systems:    All systems reviewed and negative except where noted in HPI.   Physical Exam:  BP 117/76 (BP Location: Left Arm, Patient Position: Sitting, Cuff Size: Large)   Pulse (!) 109   Resp 18   Wt (!) 362 lb (164.2 kg)   BMI 60.24 kg/m  No LMP recorded.  General:   Alert, Obese, Well-developed, well-nourished, pleasant and cooperative in NAD Head:  Normocephalic and atraumatic. Eyes:  Sclera clear, no icterus.   Conjunctiva pink. Ears:  Normal auditory acuity. Nose:  No deformity, discharge, or lesions. Mouth:  No deformity or lesions,oropharynx pink & moist. Neck:  Supple; no masses or thyromegaly. Lungs:  Respirations even and unlabored.  Clear throughout to auscultation.   No wheezes, crackles, or rhonchi. No acute distress. Heart:  Regular rate and rhythm; no murmurs, clicks, rubs, or gallops. Abdomen:  Normal bowel sounds. Soft, nontender, limited abdominal exam given her body  habitus.  No guarding or rebound tenderness.   Rectal: Not performed Msk:  Symmetrical without gross deformities. Good, equal movement & strength bilaterally. Pulses:  Normal pulses noted. Extremities:  No clubbing or edema.  No cyanosis. Neurologic:  Alert and oriented x3;  grossly normal neurologically. Skin:  Intact without significant lesions or rashes. No jaundice. Psych:  Alert and cooperative. Normal mood and affect.  Imaging Studies: reviewed  Assessment and Plan:   Kathrin GreathouseZakyra Gaubert is a 22 y.o. African-American female with morbid obesity, hypertension presents for follow-up of dyspepsia, GERD with history of heavy NSAID use for chronic joint pains. She is no longer taking NSAIDs. EGD revealed erosive esophagitis and medium-sized hiatal hernia which can explain her symptoms. Workup of microcytosis revealed normal hemoglobin, ferritin and B12 levels  - increase omeprazole to 40 mg twice a day - reiterated on healthy lifestyle, weight loss to control symptoms of acid reflux - recheck ferritin, CBC in 3 months, if low, will perform colonoscopy   Follow up in 3 months   Arlyss Repressohini R Azim Gillingham, MD

## 2018-01-03 ENCOUNTER — Encounter: Payer: Self-pay | Admitting: Gastroenterology

## 2018-03-09 ENCOUNTER — Emergency Department: Payer: BLUE CROSS/BLUE SHIELD

## 2018-03-09 ENCOUNTER — Encounter: Payer: Self-pay | Admitting: Emergency Medicine

## 2018-03-09 ENCOUNTER — Emergency Department
Admission: EM | Admit: 2018-03-09 | Discharge: 2018-03-09 | Disposition: A | Discharge status: Home / Self Care | Payer: BLUE CROSS/BLUE SHIELD | Attending: Emergency Medicine | Admitting: Emergency Medicine

## 2018-03-09 ENCOUNTER — Other Ambulatory Visit: Payer: Self-pay

## 2018-03-09 DIAGNOSIS — G43011 Migraine without aura, intractable, with status migrainosus: Secondary | ICD-10-CM | POA: Insufficient documentation

## 2018-03-09 DIAGNOSIS — J45909 Unspecified asthma, uncomplicated: Secondary | ICD-10-CM | POA: Insufficient documentation

## 2018-03-09 DIAGNOSIS — E8881 Metabolic syndrome: Secondary | ICD-10-CM | POA: Insufficient documentation

## 2018-03-09 DIAGNOSIS — I10 Essential (primary) hypertension: Secondary | ICD-10-CM | POA: Insufficient documentation

## 2018-03-09 DIAGNOSIS — Z79899 Other long term (current) drug therapy: Secondary | ICD-10-CM | POA: Insufficient documentation

## 2018-03-09 DIAGNOSIS — F17210 Nicotine dependence, cigarettes, uncomplicated: Secondary | ICD-10-CM | POA: Insufficient documentation

## 2018-03-09 DIAGNOSIS — Z7984 Long term (current) use of oral hypoglycemic drugs: Secondary | ICD-10-CM | POA: Insufficient documentation

## 2018-03-09 LAB — URINALYSIS, COMPLETE (UACMP) WITH MICROSCOPIC
Bacteria, UA: NONE SEEN
Bilirubin Urine: NEGATIVE
GLUCOSE, UA: NEGATIVE mg/dL
Hgb urine dipstick: NEGATIVE
Ketones, ur: NEGATIVE mg/dL
Leukocytes, UA: NEGATIVE
Nitrite: NEGATIVE
PROTEIN: NEGATIVE mg/dL
Specific Gravity, Urine: 1.008 (ref 1.005–1.030)
pH: 7 (ref 5.0–8.0)

## 2018-03-09 LAB — CBC
HCT: 36.7 % (ref 35.0–47.0)
Hemoglobin: 12.1 g/dL (ref 12.0–16.0)
MCH: 25.7 pg — AB (ref 26.0–34.0)
MCHC: 33.1 g/dL (ref 32.0–36.0)
MCV: 77.6 fL — AB (ref 80.0–100.0)
PLATELETS: 380 10*3/uL (ref 150–440)
RBC: 4.72 MIL/uL (ref 3.80–5.20)
RDW: 14.6 % — ABNORMAL HIGH (ref 11.5–14.5)
WBC: 6.1 10*3/uL (ref 3.6–11.0)

## 2018-03-09 LAB — COMPREHENSIVE METABOLIC PANEL
ALT: 21 U/L (ref 0–44)
AST: 25 U/L (ref 15–41)
Albumin: 4.1 g/dL (ref 3.5–5.0)
Alkaline Phosphatase: 66 U/L (ref 38–126)
Anion gap: 8 (ref 5–15)
BUN: 8 mg/dL (ref 6–20)
CHLORIDE: 103 mmol/L (ref 98–111)
CO2: 28 mmol/L (ref 22–32)
CREATININE: 0.83 mg/dL (ref 0.44–1.00)
Calcium: 8.8 mg/dL — ABNORMAL LOW (ref 8.9–10.3)
GFR calc non Af Amer: >60 mL/min (ref >60)
Glucose, Bld: 95 mg/dL (ref 70–99)
POTASSIUM: 3.9 mmol/L (ref 3.5–5.1)
Sodium: 139 mmol/L (ref 135–145)
Total Bilirubin: 0.8 mg/dL (ref 0.3–1.2)
Total Protein: 7.6 g/dL (ref 6.5–8.1)

## 2018-03-09 LAB — POCT PREGNANCY, URINE: Preg Test, Ur: NEGATIVE

## 2018-03-09 MED ORDER — DIPHENHYDRAMINE HCL 50 MG/ML IJ SOLN
25.0000 mg | Freq: Once | INTRAMUSCULAR | Status: AC
  Administered 2018-03-09: 25 mg via INTRAVENOUS
  Filled 2018-03-09: qty 1

## 2018-03-09 MED ORDER — KETOROLAC TROMETHAMINE 30 MG/ML IJ SOLN
30.0000 mg | Freq: Once | INTRAMUSCULAR | Status: AC
  Administered 2018-03-09: 30 mg via INTRAVENOUS
  Filled 2018-03-09: qty 1

## 2018-03-09 MED ORDER — METHYLPREDNISOLONE SODIUM SUCC 125 MG IJ SOLR
80.0000 mg | Freq: Once | INTRAMUSCULAR | Status: AC
  Administered 2018-03-09: 80 mg via INTRAVENOUS
  Filled 2018-03-09: qty 2

## 2018-03-09 MED ORDER — PROMETHAZINE HCL 25 MG/ML IJ SOLN
12.5000 mg | Freq: Once | INTRAMUSCULAR | Status: AC
  Administered 2018-03-09: 12.5 mg via INTRAVENOUS
  Filled 2018-03-09: qty 1

## 2018-03-09 MED ORDER — ONDANSETRON HCL 4 MG/2ML IJ SOLN
INTRAMUSCULAR | Status: AC
  Administered 2018-03-09: 4 mg via INTRAVENOUS
  Filled 2018-03-09: qty 2

## 2018-03-09 MED ORDER — PROCHLORPERAZINE EDISYLATE 10 MG/2ML IJ SOLN
10.0000 mg | Freq: Once | INTRAMUSCULAR | Status: AC
Start: 2018-03-09 — End: 2018-03-09
  Administered 2018-03-09: 10 mg via INTRAVENOUS
  Filled 2018-03-09: qty 2

## 2018-03-09 MED ORDER — PREDNISONE 10 MG PO TABS: 40.0000 mg | ORAL_TABLET | Freq: Every day | ORAL | 0 refills | Status: AC

## 2018-03-09 MED ORDER — SUMATRIPTAN SUCCINATE 6 MG/0.5ML ~~LOC~~ SOLN
6.0000 mg | Freq: Once | SUBCUTANEOUS | Status: AC
  Administered 2018-03-09: 6 mg via SUBCUTANEOUS
  Filled 2018-03-09: qty 0.5

## 2018-03-09 MED ORDER — BUTALBITAL-APAP-CAFFEINE 50-325-40 MG PO TABS: 1.0000 | ORAL_TABLET | Freq: Three times a day (TID) | ORAL | 0 refills | Status: AC | PRN

## 2018-03-09 MED ORDER — SODIUM CHLORIDE 0.9 % IV BOLUS
1000.0000 mL | Freq: Once | INTRAVENOUS | Status: AC
  Administered 2018-03-09: 1000 mL via INTRAVENOUS

## 2018-03-09 MED ORDER — ONDANSETRON HCL 4 MG/2ML IJ SOLN
4.0000 mg | Freq: Once | INTRAMUSCULAR | Status: AC
  Administered 2018-03-09: 4 mg via INTRAVENOUS

## 2018-03-09 NOTE — ED Triage Notes (Signed)
Migraine x 4 days, history of same. Denies head injury, denies use of blood thinners.

## 2018-03-09 NOTE — ED Notes (Signed)
Patient tolerating apple sauce, graham crackers and water well.

## 2018-03-09 NOTE — ED Notes (Signed)
States nausea is better   But conts to have pressure in head

## 2018-03-09 NOTE — ED Notes (Signed)
Back from ct scan   Family at bedside. 

## 2018-03-09 NOTE — ED Notes (Signed)
States she has had some intermittent syncopal episodes  Last one was yesterday

## 2018-03-09 NOTE — ED Provider Notes (Signed)
[facility] Emergency Department Provider Note  ____________________________________________  Time seen: Approximately 9:13 AM  I have reviewed the triage vital signs and the nursing notes.   HISTORY  Chief Complaint Migraine    HPI Joann Miller is a 22 y.o. female with past medical history of migraines presents the emergency department for evaluation of migraine and photophobia for 4 days.  Patient gets daily migraines.  Usually migraines only last 1 to 2 days.  This one is getting worse and not better.  Pain starts at the front of her head and radiates around the sides to the back of her head.  She has had not had any nausea this morning but did have nausea a couple of days ago.  Migraine does not change in character with position.  She has not taken any medications yet for symptoms.  She was recently prescribed a new migraine medication but cannot afford it.  She sees a neurologist.  No visual changes, dizziness.   Past Medical History:  Diagnosis Date  . Asthma   . Migraine   . Murmur   . Obesity   . Sciatic leg pain   . Syncope   . Tachycardia     Patient Active Problem List   Diagnosis Date Noted  . 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 Essential hypertension 11/07/2017  . Hypercholesterolemia with LDL greater than 190 mg/dL 16/05/9603  . Chronic insomnia 04/07/2015  . Morbid obesity due to excess calories (HCC) 10/29/2014  . Acute chest pain 08/24/2014  . 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 Intractable migraine without aura and without status migrainosus 08/24/2014  . Syncope and collapse 08/24/2014  . Seizure-like activity (HCC) 04/20/2014  . Contraception management 03/25/2014  . Depression 10/27/2013  . Suicide attempt (HCC) 08/07/2013  . Insulin resistance 06/03/2013  . Amenorrhea 02/06/2013  . Constipation 11/22/2012  . Seasonal allergies 11/22/2012  . Hidradenitis suppurativa 12/27/2011    Past Surgical History:   Procedure Laterality Date  . ESOPHAGOGASTRODUODENOSCOPY (EGD) WITH PROPOFOL N/A 11/27/2017   Procedure: ESOPHAGOGASTRODUODENOSCOPY (EGD) WITH PROPOFOL;  Surgeon: Toney Reil, MD;  Location: [facility] ENDOSCOPY;  Service: Gastroenterology;  Laterality: N/A;  . FOREIGN BODY REMOVAL EAR Right 05/10/2015   Procedure: REMOVAL FOREIGN BODY EAR;  Surgeon: Vernie Murders, MD;  Location: [facility] ORS;  Service: ENT;  Laterality: Right;  . KELOID EXCISION    . MINOR EXCISION EAR CANAL MASS Right 05/10/2015   Procedure: MINOR EXCISION EAR CANAL MASS/EXCISION KELOID AND ADVANCEMENT FLAP REPAIR;  Surgeon: Vernie Murders, MD;  Location: [facility] ORS;  Service: ENT;  Laterality: Right;    Prior to Admission medications   Medication Sig Start Date End Date Taking? Authorizing Provider  albuterol (PROVENTIL HFA;VENTOLIN HFA) 108 (90 Base) MCG/ACT inhaler Inhale 2 puffs into the lungs every 6 (six) hours as needed for wheezing or shortness of breath.    [provider]  butalbital-acetaminophen-caffeine Marikay Alar, ESGIC) [phone] MG tablet Take 1 tablet by mouth 3 (three) times daily as needed for headache. 03/09/18 03/09/19  Enid Derry, PA-C  DULoxetine (CYMBALTA) 30 MG capsule Take by mouth. 12/13/17 12/13/18  [provider]  FLUoxetine (PROZAC) 20 MG capsule Take by mouth. 03/25/14   [provider]  Fluticasone-Salmeterol (ADVAIR DISKUS) 250-50 MCG/DOSE AEPB Inhale into the lungs.    [provider]  hydrochlorothiazide (HYDRODIURIL) 25 MG tablet Take by mouth. 12/10/17 12/10/18  [provider]  ipratropium (ATROVENT) 0.03 % nasal spray Place into the nose. 11/07/17 11/07/18  [provider]  medroxyPROGESTERone (DEPO-PROVERA) 150 MG/ML injection INJECT 1 ML (150 MG TOTAL) INTO THE MUSCLE EVERY 3 (THREE) MONTHS 08/27/17   [provider]  metFORMIN (GLUCOPHAGE) 500 MG tablet TAKE 1 TABLET BY MOUTH EVERY DAY WITH BREAKFAST 11/07/17   [provider]   montelukast (SINGULAIR) 10 MG tablet Take 10 mg by mouth at bedtime.    [provider]  Multiple Vitamin (MULTI-VITAMINS) TABS Take by mouth.    [provider]  nortriptyline (PAMELOR) 10 MG capsule TAKE 1 CAPSULE BY MOUTH EVERY DAY IN THE EVENING 11/07/17   [provider]  omeprazole (PRILOSEC) 40 MG capsule Take 1 capsule (40 mg total) by mouth 2 (two) times daily before a meal. 12/31/17 03/31/18  Vanga, Loel Dubonnet, MD  predniSONE (DELTASONE) 10 MG tablet Take 4 tablets (40 mg total) by mouth daily for 5 days. 03/09/18 03/14/18  Enid Derry, PA-C  simvastatin (ZOCOR) 10 MG tablet Take by mouth. 12/13/17 12/13/18  [provider]    Allergies Atorvastatin; Peanut oil; Shellfish allergy; Cat hair extract; Pollen extract; and Penicillins  No family history on file.  Social History Social History   Tobacco Use  . Smoking status: Current Some Day Smoker    Packs/day: 0.20  . Smokeless tobacco: Never Used  Substance Use Topics  . Alcohol use: Yes    Comment: occasionally  . Drug use: No     Review of Systems  Constitutional: No fever/chills Cardiovascular: No chest pain. Respiratory: No SOB. Gastrointestinal: No abdominal pain.  No nausea, no vomiting.  Musculoskeletal: Negative for musculoskeletal pain. Skin: Negative for rash, abrasions, lacerations, ecchymosis.   ____________________________________________   PHYSICAL EXAM:  VITAL SIGNS: ED Triage Vitals  Enc Vitals Group     BP 03/09/18 0837 125/88     Pulse Rate 03/09/18 0834 83     Resp 03/09/18 0834 20     Temp 03/09/18 0834 98.3 F (36.8 C)     Temp Source 03/09/18 0834 Oral     SpO2 03/09/18 0834 98 %     Weight 03/09/18 0835 (!) 371 lb (168.3 kg)     Height 03/09/18 0835 5\' 5"  (1.651 m)     Head Circumference --      Peak Flow --      Pain Score 03/09/18 0835 10     Pain Loc --      Pain Edu? --      Excl. in GC? --      Constitutional: Alert and oriented. Well  appearing and in no acute distress. Eyes: Conjunctivae are normal. PERRL. EOMI. no papilledema. Head: Atraumatic. ENT:      Ears:      Nose: No congestion/rhinnorhea.      Mouth/Throat: Mucous membranes are moist.  Neck: No stridor. No cervical spine tenderness to palpation. Cardiovascular: Normal rate, regular rhythm.  Good peripheral circulation. Respiratory: Normal respiratory effort without tachypnea or retractions. Lungs CTAB. Good air entry to the bases with no decreased or absent breath sounds. Gastrointestinal: Bowel sounds 4 quadrants. Soft and nontender to palpation. No guarding or rigidity. No palpable masses. No distention. Musculoskeletal: Full range of motion to all extremities. No gross deformities appreciated. Neurologic: Normal speech and language. No gross focal neurologic deficits are appreciated.  Cranial nerves: 2-10 normal as tested. Strength 5/5 in upper and lower extremities Cerebellar: Finger-nose-finger WNL, Heel to shin WNL Sensorimotor: No pronator drift, clonus, sensory loss or abnormal reflexes. Speech: No dysarthria or expressive aphasia Skin:  Skin is  warm, dry and intact. No rash noted. Psychiatric: Mood and affect are normal. Speech and behavior are normal. Patient exhibits appropriate insight and judgement.   ____________________________________________   LABS (all labs ordered are listed, but only abnormal results are displayed)  Labs Reviewed  CBC - Abnormal; Notable for the following components:      Result Value   MCV 77.6 (*)    MCH 25.7 (*)    RDW 14.6 (*)    All other components within normal limits  COMPREHENSIVE METABOLIC PANEL - Abnormal; Notable for the following components:   Calcium 8.8 (*)    All other components within normal limits  URINALYSIS, COMPLETE (UACMP) WITH MICROSCOPIC - Abnormal; Notable for the following components:   Color, Urine STRAW (*)    APPearance CLEAR (*)    All other components within normal limits  POC  URINE PREG, ED  POCT PREGNANCY, URINE   ____________________________________________  EKG  NSR  ____________________________________________  RADIOLOGY   Ct Head Wo Contrast  Result Date: 03/09/2018 CLINICAL DATA:  Headache, chronic, normal neuro exam. EXAM: CT HEAD WITHOUT CONTRAST TECHNIQUE: Contiguous axial images were obtained from the base of the skull through the vertex without intravenous contrast. COMPARISON:  CT head without contrast 05/03/2014 FINDINGS: Brain: No acute infarct, hemorrhage, or mass lesion is present. The ventricles are of normal size. No significant white matter disease is present. The brainstem and cerebellum are normal. Vascular: No hyperdense vessel or unexpected calcification. Skull: Calvarium is intact. No focal lytic or blastic lesions are present. Sinuses/Orbits: The paranasal sinuses and mastoid air cells are clear. Globes and orbits are within normal limits. IMPRESSION: Negative CT of the head. Electronically Signed   By: Marin Robertshristopher  Mattern M.D.   On: 03/09/2018 12:55    ____________________________________________    PROCEDURES  Procedure(s) performed:    Procedures    Medications  sodium chloride 0.9 % bolus 1,000 mL (0 mLs Intravenous Stopped 03/09/18 1129)  promethazine (PHENERGAN) injection 12.5 mg (12.5 mg Intravenous Given 03/09/18 1021)  ketorolac (TORADOL) 30 MG/ML injection 30 mg (30 mg Intravenous Given 03/09/18 1020)  SUMAtriptan (IMITREX) injection 6 mg (6 mg Subcutaneous Given 03/09/18 1151)  prochlorperazine (COMPAZINE) injection 10 mg (10 mg Intravenous Given 03/09/18 1443)  diphenhydrAMINE (BENADRYL) injection 25 mg (25 mg Intravenous Given 03/09/18 1440)  methylPREDNISolone sodium succinate (SOLU-MEDROL) 125 mg/2 mL injection 80 mg (80 mg Intravenous Given 03/09/18 1442)  ondansetron (ZOFRAN) injection 4 mg (4 mg Intravenous Given 03/09/18 1545)     ____________________________________________   INITIAL IMPRESSION /  ASSESSMENT AND PLAN / ED COURSE  Pertinent labs & imaging results that were available during my care of the patient were reviewed by me and considered in my medical decision making (see chart for details).  Review of the [place] CSRS was performed in accordance of the [facility] prior to dispensing any controlled drugs.     Patient presented to the emergency department for evaluation of migraine for 4 days.  Vital signs and exam are reassuring.  Blood work is largely unremarkable.  No infection on urinalysis.  CT scan negative for acute abnormalities.  Neuro exam within normal limits.  Patient denies any relief with Toradol, Imitrex, fluids, Phenergan.  After 3 hours in the emergency department, patient  states that she had an episode of syncope yesterday as well.  She was working, felt dizzy and blacked out.  Her coworker caught her before she hit the floor.  Case was discussed with Dr. Juliette AlcideMelinda, who recommends Compazine, Benadryl, Solu-Medrol  and discharged home with prescriptions for prednisone.  Patient will be discharged home with prescriptions for prednisone and fioricet. Patient is to follow up with neurology as directed. Patient is given ED precautions to return to the ED for any worsening or new symptoms.     ____________________________________________  FINAL CLINICAL IMPRESSION(S) / ED DIAGNOSES  Final diagnoses:  Intractable migraine without aura and with status migrainosus      NEW MEDICATIONS STARTED DURING THIS VISIT:  ED Discharge Orders         Ordered    butalbital-acetaminophen-caffeine (FIORICET, ESGIC) 50-325-40 MG tablet  3 times daily PRN     03/09/18 1552    predniSONE (DELTASONE) 10 MG tablet  Daily     03/09/18 1552              This chart was dictated using voice recognition software/Dragon. Despite best efforts to proofread, errors can occur which can change the meaning. Any change was purely unintentional.    Enid Derry, PA-C 03/10/18 1522     Arnaldo Natal, MD 03/10/18 [phone]

## 2018-03-09 NOTE — ED Notes (Signed)
See triage note  Presents with migraine  States she has a hx of same  But this headache is different  Having more pressure mainly to top of head  States she sees a neurologist and has tried several meds but they don't work  Last med rx' was too expensive  Positive nausea positive photophobia

## 2018-03-09 NOTE — ED Provider Notes (Signed)
EKG read and interpreted by me shows normal sinus rhythm rate of 79 normal axis there is some flipped T's in V2 through 4 these are old there are no acute changes computer is reading a prolonged QT interval the QTC is measured by the computer of 481 ms   Arnaldo NatalMalinda, Mell Mellott F, MD 03/09/18 1424

## 2018-04-07 ENCOUNTER — Encounter: Payer: Self-pay | Admitting: Emergency Medicine

## 2018-04-07 ENCOUNTER — Emergency Department: Payer: Self-pay

## 2018-04-07 ENCOUNTER — Emergency Department
Admission: EM | Admit: 2018-04-07 | Discharge: 2018-04-07 | Disposition: A | Discharge status: Home / Self Care | Payer: Self-pay | Attending: Emergency Medicine | Admitting: Emergency Medicine

## 2018-04-07 DIAGNOSIS — J9801 Acute bronchospasm: Secondary | ICD-10-CM

## 2018-04-07 DIAGNOSIS — Z91010 Allergy to peanuts: Secondary | ICD-10-CM | POA: Insufficient documentation

## 2018-04-07 DIAGNOSIS — Z79899 Other long term (current) drug therapy: Secondary | ICD-10-CM | POA: Insufficient documentation

## 2018-04-07 DIAGNOSIS — R9431 Abnormal electrocardiogram [ECG] [EKG]: Secondary | ICD-10-CM

## 2018-04-07 DIAGNOSIS — R091 Pleurisy: Secondary | ICD-10-CM

## 2018-04-07 DIAGNOSIS — J069 Acute upper respiratory infection, unspecified: Secondary | ICD-10-CM

## 2018-04-07 DIAGNOSIS — F17210 Nicotine dependence, cigarettes, uncomplicated: Secondary | ICD-10-CM | POA: Insufficient documentation

## 2018-04-07 DIAGNOSIS — I10 Essential (primary) hypertension: Secondary | ICD-10-CM | POA: Insufficient documentation

## 2018-04-07 DIAGNOSIS — Z7984 Long term (current) use of oral hypoglycemic drugs: Secondary | ICD-10-CM | POA: Insufficient documentation

## 2018-04-07 LAB — CBC
HCT: 36.2 % (ref 35.0–47.0)
HEMOGLOBIN: 12.3 g/dL (ref 12.0–16.0)
MCH: 26.3 pg (ref 26.0–34.0)
MCHC: 33.9 g/dL (ref 32.0–36.0)
MCV: 77.6 fL — ABNORMAL LOW (ref 80.0–100.0)
Platelets: 366 10*3/uL (ref 150–440)
RBC: 4.66 MIL/uL (ref 3.80–5.20)
RDW: 14.7 % — ABNORMAL HIGH (ref 11.5–14.5)
WBC: 5.3 10*3/uL (ref 3.6–11.0)

## 2018-04-07 LAB — TROPONIN I: Troponin I: <0.03 ng/mL (ref <0.03)

## 2018-04-07 LAB — BASIC METABOLIC PANEL
ANION GAP: 9 (ref 5–15)
BUN: 9 mg/dL (ref 6–20)
CO2: 26 mmol/L (ref 22–32)
Calcium: 8.8 mg/dL — ABNORMAL LOW (ref 8.9–10.3)
Chloride: 105 mmol/L (ref 98–111)
Creatinine, Ser: 0.84 mg/dL (ref 0.44–1.00)
Glucose, Bld: 101 mg/dL — ABNORMAL HIGH (ref 70–99)
Potassium: 3.9 mmol/L (ref 3.5–5.1)
Sodium: 140 mmol/L (ref 135–145)

## 2018-04-07 LAB — FIBRIN DERIVATIVES D-DIMER (ARMC ONLY): FIBRIN DERIVATIVES D-DIMER (ARMC): 183.14 ng{FEU}/mL (ref 0.00–499.00)

## 2018-04-07 LAB — HCG, QUANTITATIVE, PREGNANCY: hCG, Beta Chain, Quant, S: <1 m[IU]/mL (ref <5)

## 2018-04-07 MED ORDER — DOXYCYCLINE HYCLATE 100 MG PO TABS
100.0000 mg | ORAL_TABLET | Freq: Once | ORAL | Status: AC
  Administered 2018-04-07: 100 mg via ORAL
  Filled 2018-04-07: qty 1

## 2018-04-07 MED ORDER — PREDNISONE 20 MG PO TABS: 40.0000 mg | ORAL_TABLET | Freq: Every day | ORAL | 0 refills | Status: DC

## 2018-04-07 MED ORDER — DOXYCYCLINE HYCLATE 100 MG PO CAPS: 100.0000 mg | ORAL_CAPSULE | Freq: Two times a day (BID) | ORAL | 0 refills | Status: DC

## 2018-04-07 MED ORDER — ALBUTEROL SULFATE HFA 108 (90 BASE) MCG/ACT IN AERS: 2.0000 | INHALATION_SPRAY | Freq: Four times a day (QID) | RESPIRATORY_TRACT | 2 refills | Status: AC | PRN

## 2018-04-07 MED ORDER — IPRATROPIUM-ALBUTEROL 0.5-2.5 (3) MG/3ML IN SOLN
3.0000 mL | Freq: Once | RESPIRATORY_TRACT | Status: AC
  Administered 2018-04-07: 3 mL via RESPIRATORY_TRACT
  Filled 2018-04-07: qty 3

## 2018-04-07 MED ORDER — PREDNISONE 20 MG PO TABS
40.0000 mg | ORAL_TABLET | Freq: Once | ORAL | Status: AC
  Administered 2018-04-07: 40 mg via ORAL
  Filled 2018-04-07: qty 2

## 2018-04-07 NOTE — ED Provider Notes (Signed)
[facility] Emergency Department Provider Note   ____________________________________________   First MD Initiated Contact with Patient 04/07/18 1042     (approximate)  I have reviewed the triage vital signs and the nursing notes.   HISTORY  Chief Complaint Chest Pain and Sore Throat    HPI Joann Miller is a 22 y.o. female with a history of asthma  Patient reports for about 2 weeks now she has had a cough chills and some occasional wheezing.  She has not tried taking any medications.  She is felt hot sweaty at home at times, and she also had an episode with some loose diarrhea and nausea that resolved about 5 days ago that started about the same times the cough.  She does have a history of asthma, has had some slight wheezing.  Not using any breathing treatments.  Denies chest pain except for an occasional sharp discomfort in the left side of the ribs when she coughs.  Does like she "strained a muscle".  She does cough she occasionally brings up some slight amount of sputum with some occasional slight tinges of blood in it  Takes no birth control.  No leg swelling.  No history of blood clots.  No recent traumas or surgeries.  No heavy chest pressure.  Denies history of cardiac disease.  Denies pregnancy.   Past Medical History:  Diagnosis Date  . Asthma   . Migraine   . Murmur   . Obesity   . Sciatic leg pain   . Syncope   . Tachycardia     Patient Active Problem List   Diagnosis Date Noted  . 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 Essential hypertension 11/07/2017  . Hypercholesterolemia with LDL greater than 190 mg/dL 73/41/9379  . Chronic insomnia 04/07/2015  . Morbid obesity due to excess calories (HCC) 10/29/2014  . Acute chest pain 08/24/2014  . 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 Intractable migraine without aura and without status migrainosus 08/24/2014  . Syncope and collapse 08/24/2014  . Seizure-like activity (HCC)  04/20/2014  . Contraception management 03/25/2014  . Depression 10/27/2013  . Suicide attempt (HCC) 08/07/2013  . Insulin resistance 06/03/2013  . Amenorrhea 02/06/2013  . Constipation 11/22/2012  . Seasonal allergies 11/22/2012  . Hidradenitis suppurativa 12/27/2011    Past Surgical History:  Procedure Laterality Date  . ESOPHAGOGASTRODUODENOSCOPY (EGD) WITH PROPOFOL N/A 11/27/2017   Procedure: ESOPHAGOGASTRODUODENOSCOPY (EGD) WITH PROPOFOL;  Surgeon: Toney Reil, MD;  Location: [facility] ENDOSCOPY;  Service: Gastroenterology;  Laterality: N/A;  . FOREIGN BODY REMOVAL EAR Right 05/10/2015   Procedure: REMOVAL FOREIGN BODY EAR;  Surgeon: Vernie Murders, MD;  Location: [facility] ORS;  Service: ENT;  Laterality: Right;  . KELOID EXCISION    . MINOR EXCISION EAR CANAL MASS Right 05/10/2015   Procedure: MINOR EXCISION EAR CANAL MASS/EXCISION KELOID AND ADVANCEMENT FLAP REPAIR;  Surgeon: Vernie Murders, MD;  Location: [facility] ORS;  Service: ENT;  Laterality: Right;    Prior to Admission medications   Medication Sig Start Date End Date Taking? Authorizing Provider  albuterol (PROVENTIL HFA;VENTOLIN HFA) 108 (90 Base) MCG/ACT inhaler Inhale 2 puffs into the lungs every 6 (six) hours as needed for wheezing or shortness of breath.    [provider]  butalbital-acetaminophen-caffeine Marikay Alar, ESGIC) [id number] MG tablet Take 1 tablet by mouth 3 (three) times daily as needed for headache. 03/09/18 03/09/19  Enid Derry, PA-C  DULoxetine (CYMBALTA) 30 MG capsule Take by mouth. 12/13/17 12/13/18  [provider]  FLUoxetine (PROZAC) 20 MG capsule Take by mouth. 03/25/14   [provider]  Fluticasone-Salmeterol (ADVAIR DISKUS) 250-50 MCG/DOSE AEPB Inhale into the lungs.    [provider]  hydrochlorothiazide (HYDRODIURIL) 25 MG tablet Take by mouth. 12/10/17 12/10/18  [provider]  ipratropium (ATROVENT) 0.03 % nasal spray Place into the nose. 11/07/17 11/07/18   [provider]  medroxyPROGESTERone (DEPO-PROVERA) 150 MG/ML injection INJECT 1 ML (150 MG TOTAL) INTO THE MUSCLE EVERY 3 (THREE) MONTHS 08/27/17   [provider]  metFORMIN (GLUCOPHAGE) 500 MG tablet TAKE 1 TABLET BY MOUTH EVERY DAY WITH BREAKFAST 11/07/17   [provider]  montelukast (SINGULAIR) 10 MG tablet Take 10 mg by mouth at bedtime.    [provider]  Multiple Vitamin (MULTI-VITAMINS) TABS Take by mouth.    [provider]  nortriptyline (PAMELOR) 10 MG capsule TAKE 1 CAPSULE BY MOUTH EVERY DAY IN THE EVENING 11/07/17   [provider]  omeprazole (PRILOSEC) 40 MG capsule Take 1 capsule (40 mg total) by mouth 2 (two) times daily before a meal. 12/31/17 03/31/18  Vanga, Loel Dubonnet, MD  simvastatin (ZOCOR) 10 MG tablet Take by mouth. 12/13/17 12/13/18  [provider]    Allergies Atorvastatin; Peanut oil; Shellfish allergy; Cat hair extract; Pollen extract; and Penicillins  No family history on file.  Social History Social History   Tobacco Use  . Smoking status: Current Some Day Smoker    Packs/day: 0.20  . Smokeless tobacco: Never Used  Substance Use Topics  . Alcohol use: Yes    Comment: occasionally  . Drug use: No    Review of Systems Constitutional: Fevers chills some fatigue. Eyes: No visual changes. ENT: No sore throat. Cardiovascular: See HPI respiratory: Denies shortness of breath.  Does report some wheezing however. Gastrointestinal: No abdominal pain.  Had some nausea and diarrhea which resolved about 5 days ago.  No constipation. Genitourinary: Negative for dysuria. Musculoskeletal: Negative for back pain. Skin: Negative for rash. Neurological: Negative for headaches, focal weakness or numbness.    ____________________________________________   PHYSICAL EXAM:  VITAL SIGNS: ED Triage Vitals [04/07/18 0856]  Enc Vitals Group     BP (!) 155/91     Pulse Rate 84     Resp 18     Temp 99.3  F (37.4 C)     Temp Source Oral     SpO2 98 %     Weight (!) 365 lb (165.6 kg)     Height 5\' 6"  (1.676 m)     Head Circumference      Peak Flow      Pain Score 10     Pain Loc      Pain Edu?      Excl. in GC?     Constitutional: Alert and oriented. Well appearing and in no acute distress.  She is very pleasant. Eyes: Conjunctivae are normal. Head: Atraumatic. Nose: No congestion/rhinnorhea. Mouth/Throat: Mucous membranes are moist.  No tonsillar hypertrophy or exudates. Neck: No stridor.   Cardiovascular: Normal rate, regular rhythm. Grossly normal heart sounds.  Good peripheral circulation. Respiratory: Normal respiratory effort.  No retractions. Lungs clear in the upper limits but some mild scant end expiratory wheezing noted in the lower lobes bilaterally.  Reports a slight pleuritic discomfort over the left lateral chest wall with deep inspiration. Gastrointestinal: Soft and nontender. No distention. Musculoskeletal: No lower extremity tenderness nor edema.  No thigh tenderness.  No venous cords or congestion. Neurologic:  Normal  speech and language. No gross focal neurologic deficits are appreciated.  Skin:  Skin is warm, dry and intact. No rash noted. Psychiatric: Mood and affect are normal. Speech and behavior are normal.  ____________________________________________   LABS (all labs ordered are listed, but only abnormal results are displayed)  Labs Reviewed  BASIC METABOLIC PANEL - Abnormal; Notable for the following components:      Result Value   Glucose, Bld 101 (*)    Calcium 8.8 (*)    All other components within normal limits  CBC - Abnormal; Notable for the following components:   MCV 77.6 (*)    RDW 14.7 (*)    All other components within normal limits  TROPONIN I  HCG, QUANTITATIVE, PREGNANCY  FIBRIN DERIVATIVES D-DIMER ([facility] ONLY)   ____________________________________________  EKG  Reviewed by me at 9 AM Heart rate 85 9 QRS 99 QTc 480 Normal  sinus, mild prolongation of QT interval, no evidence of acute ischemia. ____________________________________________  RADIOLOGY  Dg Chest 2 View  Result Date: 04/07/2018 CLINICAL DATA:  Body aches and sore throat.  Chest pain for 2 weeks. EXAM: CHEST - 2 VIEW COMPARISON:  November 22, 2016 FINDINGS: The heart, hila, and mediastinum are normal. No pneumothorax. No nodules or masses. No focal infiltrates. IMPRESSION: No active cardiopulmonary disease. Electronically Signed   By: Gerome Sam III M.D   On: 04/07/2018 09:55   Chest x-ray reviewed by me, negative for acute ____________________________________________   PROCEDURES  Procedure(s) performed: None  Procedures  Critical Care performed: No  ____________________________________________   INITIAL IMPRESSION / ASSESSMENT AND PLAN / ED COURSE  Pertinent labs & imaging results that were available during my care of the patient were reviewed by me and considered in my medical decision making (see chart for details).  Clinical history and exam seem to suggest likely viral etiology of cough with upper respiratory infection, some mild bronchospasm without hypoxia or increased work of breathing with a known history of asthma.  Nontoxic very well-appearing, also having some left-sided chest discomfort allergic in nature without evidence of acute cardiac etiology and low risk for acute coronary syndrome.  D-dimer performed low risk by Wells which is negative.  Chest x-ray reassuring.  Given the patient's ongoing symptoms reported ongoing fevers and chills for about 2 weeks with a history of asthma, will treat with doxycycline.  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  Discussed with patient avoiding medications that could prolong this, checking with primary care doctor for follow-up on this for which she has an  appointment on Tuesday.  ----------------------------------------- 2:28 PM on 04/07/2018 -----------------------------------------  D-dimer negative.  Patient reports feeling much better after receiving albuterol treatment steroid.  Resting comfortably in no distress.  Appears appropriate for discharge and outpatient therapy.  Return precautions and treatment recommendations and follow-up discussed with the patient who is agreeable with the plan.       ____________________________________________   FINAL CLINICAL IMPRESSION(S) / ED DIAGNOSES  Final diagnoses:  Prolonged Q-T interval on ECG  Bronchial spasm  Upper respiratory tract infection, unspecified type  Pleurisy      NEW MEDICATIONS STARTED DURING THIS VISIT:  New Prescriptions   No medications on file     Note:  This document was prepared using Dragon voice recognition software and may include unintentional dictation errors.     Sharyn Creamer, MD 04/07/18 [id number]

## 2018-04-07 NOTE — ED Notes (Signed)
This RN contacted lab and spoke with lab tech, Annice Pih in regards to D-Dimer results. Lab tech stated specimen was received at 11:30am; however it has not been processed.

## 2018-04-07 NOTE — ED Notes (Signed)
Pt was given 4oz of juice and saltine crackers. Pt denies any N/V at this time.

## 2018-04-07 NOTE — ED Notes (Signed)
Patient to Room 5, [facility] RN aware of room placement.

## 2018-04-07 NOTE — ED Notes (Signed)
EDP at bedside at this time.  

## 2018-04-07 NOTE — ED Notes (Signed)
ED provider at bedside.

## 2018-04-07 NOTE — ED Notes (Signed)
Patient to Radiology

## 2018-04-07 NOTE — ED Triage Notes (Signed)
Patient presents to the ED with body aches, sore throat and chest pain x 2 weeks.  Patient states she has also coughed up blood tinged phlegm.  Patient states her chest pain was intermittent but is now constant.  Patient's voice is very hoarse.  Patient is in no obvious distress at this time.

## 2018-04-15 ENCOUNTER — Other Ambulatory Visit
Admission: RE | Admit: 2018-04-15 | Discharge: 2018-04-15 | Disposition: A | Discharge status: Home / Self Care | Payer: Self-pay | Source: Ambulatory Visit | Attending: Gastroenterology | Admitting: Gastroenterology

## 2018-04-15 ENCOUNTER — Ambulatory Visit (INDEPENDENT_AMBULATORY_CARE_PROVIDER_SITE_OTHER): Payer: Self-pay | Admitting: Gastroenterology

## 2018-04-15 ENCOUNTER — Encounter: Payer: Self-pay | Admitting: Gastroenterology

## 2018-04-15 VITALS — BP 125/78 | HR 84 | Ht 65.0 in | Wt 372.2 lb

## 2018-04-15 DIAGNOSIS — K2100 Gastro-esophageal reflux disease with esophagitis, without bleeding: Secondary | ICD-10-CM

## 2018-04-15 DIAGNOSIS — E538 Deficiency of other specified B group vitamins: Secondary | ICD-10-CM

## 2018-04-15 DIAGNOSIS — R718 Other abnormality of red blood cells: Secondary | ICD-10-CM | POA: Insufficient documentation

## 2018-04-15 LAB — VITAMIN B12: VITAMIN B 12: 190 pg/mL (ref 180–914)

## 2018-04-15 LAB — IRON AND TIBC
Iron: 47 ug/dL (ref 28–170)
Saturation Ratios: 13 % (ref 10.4–31.8)
TIBC: 364 ug/dL (ref 250–450)
UIBC: 317 ug/dL

## 2018-04-15 LAB — FERRITIN: FERRITIN: 32 ng/mL (ref 11–307)

## 2018-04-15 NOTE — Progress Notes (Signed)
Arlyss Repress, MD [address]  [address]  [place], [place] [address]  Main: [phone]  Fax: [phone]    Gastroenterology Consultation  Referring Provider:     [facility] Primary Care Physician:  [facility] Primary Gastroenterologist:  Dr. Arlyss Repress Reason for Consultation:  dyspepsia, reflux esophagitis        HPI:   Joann Miller is a 22 y.o. female with morbid obesity referred by Dr. Dan Humphreys, [facility]  for consultation & management of epigastric and right upper quadrant pain. Onset of pain about 2-3 weeks ago, constant, sharp and associated with nausea and worse when lying on right side. She took Pepto-Bismol and noticed black pebble-like stools. Otherwise, reports having regular bowel movements without constipation or diarrhea. She went to ER last week due to worsening of symptoms She has mild microcytosis without anemia. Lipase and CMP are unremarkable.Right upper quadrant ultrasound revealed fatty liver. No evidence of cholelithiasis. She denies nausea, vomiting, eight loss or loss of appetite, denies early satiety. She denies smoking cigarettes or heavy alcohol use She is not on proton pump inhibitor  Follow-up visit 12/31/2017 She underwent EGD which revealed LA grade B reflux esophagitis. There is no evidence of H. Pylori. I started her on omeprazole 20 mg twice daily. She reports that her symptoms have improved but not completely resolved. She is working on healthy dietary habits. She has head of the bed elevated at bedtime and that is helping with heartburn.she still has mild epigastric discomfort. She is working with her psychiatrist for management of depression  Follow-up visit 04/15/2018 She reports that her symptoms are persistent, although somewhat improved. She is taking Prilosec 40 mg twice daily. She manages to eat healthy food and seeing a dietitian at [facility]. And, she did not lose any weight  NSAIDs: used to take  ibuprofen 200 mg 4-5 pills daily for several years secondary to chronic joint pains, also uses BC powders intermittently. She stopped NSAID in 11/2017  Antiplts/Anticoagulants/Anti thrombotics: none  GI Procedures:  EGD 11/27/2017 - Normal duodenal bulb and second portion of the duodenum. - Medium-sized hiatal hernia. - Normal stomach. Biopsied. - LA Grade B reflux esophagitis.  She denies family history of GI malignancy  Past Medical History:  Diagnosis Date  . Asthma   . Migraine   . Murmur   . Obesity   . Sciatic leg pain   . Syncope   . Tachycardia     Past Surgical History:  Procedure Laterality Date  . ESOPHAGOGASTRODUODENOSCOPY (EGD) WITH PROPOFOL N/A 11/27/2017   Procedure: ESOPHAGOGASTRODUODENOSCOPY (EGD) WITH PROPOFOL;  Surgeon: Toney Reil, MD;  Location: [facility] ENDOSCOPY;  Service: Gastroenterology;  Laterality: N/A;  . FOREIGN BODY REMOVAL EAR Right 05/10/2015   Procedure: REMOVAL FOREIGN BODY EAR;  Surgeon: Vernie Murders, MD;  Location: [facility] ORS;  Service: ENT;  Laterality: Right;  . KELOID EXCISION    . MINOR EXCISION EAR CANAL MASS Right 05/10/2015   Procedure: MINOR EXCISION EAR CANAL MASS/EXCISION KELOID AND ADVANCEMENT FLAP REPAIR;  Surgeon: Vernie Murders, MD;  Location: [facility] ORS;  Service: ENT;  Laterality: Right;     Current Outpatient Medications:  .  albuterol (PROVENTIL HFA;VENTOLIN HFA) 108 (90 Base) MCG/ACT inhaler, Inhale 2 puffs into the lungs every 6 (six) hours as needed for wheezing or shortness of breath., Disp: 1 Inhaler, Rfl: 2 .  DULoxetine (CYMBALTA) 30 MG capsule, Take 30 mg by mouth daily. , Disp: , Rfl:  .  Erenumab-aooe (AIMOVIG) 70 MG/ML SOAJ, Inject 70 mg into the skin every 28 (twenty-eight) days., Disp: , Rfl:  .  Fluticasone-Salmeterol (ADVAIR DISKUS) 250-50 MCG/DOSE AEPB, Inhale 1 puff into the lungs daily. , Disp: , Rfl:  .  hydrochlorothiazide (HYDRODIURIL) 25 MG tablet, Take 25 mg by mouth daily. , Disp: , Rfl:  .  metFORMIN  (GLUCOPHAGE) 500 MG tablet, Take 500 mg by mouth daily. , Disp: , Rfl: 11 .  montelukast (SINGULAIR) 10 MG tablet, Take 10 mg by mouth at bedtime., Disp: , Rfl:  .  Multiple Vitamin (MULTI-VITAMINS) TABS, Take 1 tablet by mouth daily. , Disp: , Rfl:  .  predniSONE (DELTASONE) 20 MG tablet, Take 2 tablets (40 mg total) by mouth daily with breakfast., Disp: 10 tablet, Rfl: 0 .  butalbital-acetaminophen-caffeine (FIORICET, ESGIC) 50-325-40 MG tablet, Take 1 tablet by mouth 3 (three) times daily as needed for headache. (Patient not taking: Reported on 04/07/2018), Disp: 12 tablet, Rfl: 0 .  omeprazole (PRILOSEC) 40 MG capsule, Take 1 capsule (40 mg total) by mouth 2 (two) times daily before a meal., Disp: 180 capsule, Rfl: 0   No family history on file.   Social History   Tobacco Use  . Smoking status: Current Some Day Smoker    Packs/day: 0.20  . Smokeless tobacco: Never Used  Substance Use Topics  . Alcohol use: Yes    Comment: occasionally  . Drug use: No    Allergies as of 04/15/2018 - Review Complete 04/15/2018  Allergen Reaction Noted  . Atorvastatin  11/07/2017  . Peanut oil Anaphylaxis 11/07/2017  . Shellfish allergy Anaphylaxis 02/25/2016  . Simvastatin  01/08/2018  . Cat hair extract  04/07/2016  . Pollen extract  04/07/2016  . Penicillins Rash 05/03/2015    Review of Systems:    All systems reviewed and negative except where noted in HPI.   Physical Exam:  BP 125/78   Pulse 84   Ht 5\' 5"  (1.651 m)   Wt (!) 372 lb 3.2 oz (168.8 kg)   BMI 61.94 kg/m  No LMP recorded.  General:   Alert, Obese, Well-developed, well-nourished, pleasant and cooperative in NAD Head:  Normocephalic and atraumatic. Eyes:  Sclera clear, no icterus.   Conjunctiva pink. Ears:  Normal auditory acuity. Nose:  No deformity, discharge, or lesions. Mouth:  No deformity or lesions,oropharynx pink & moist. Neck:  Supple; no masses or thyromegaly. Lungs:  Respirations even and unlabored.  Clear  throughout to auscultation.   No wheezes, crackles, or rhonchi. No acute distress. Heart:  Regular rate and rhythm; no murmurs, clicks, rubs, or gallops. Abdomen:  Normal bowel sounds. Soft, nontender, limited abdominal exam given her body habitus.  No guarding or rebound tenderness.   Rectal: Not performed Msk:  Symmetrical without gross deformities. Good, equal movement & strength bilaterally. Pulses:  Normal pulses noted. Extremities:  No clubbing or edema.  No cyanosis. Neurologic:  Alert and oriented x3;  grossly normal neurologically. Skin:  Intact without significant lesions or rashes. No jaundice. Psych:  Alert and cooperative. Normal mood and affect.  Imaging Studies: reviewed  Assessment and Plan:   Joann Miller is a 22 y.o. African-American female with morbid obesity, hypertension presents for follow-up of dyspepsia, GERD with history of heavy NSAID use for chronic joint pains. She is no longer taking NSAIDs. EGD revealed erosive esophagitis and medium-sized hiatal hernia which can explain her symptoms. Workup of microcytosis revealed normal hemoglobin, ferritin and B12 levels. She appears to be frustrated about  her reflux symptoms. I discussed with her the possibility of hiatal hernia repair or weight loss surgery. She is interested to [facility]  - continue omeprazole to 40 mg twice a day - reiterated on healthy lifestyle, weight loss to control symptoms of acid reflux - recheck ferritin, CBC today - [facility] for hernia repair and weight loss surgery  Morbid obesity, BMI 61.9 Provided her with information about bariatric wellness Center at [facility]   Follow up in 3 months   Arlyss Repressohini R Vanga, MD

## 2018-04-23 ENCOUNTER — Encounter: Payer: Self-pay | Admitting: Gastroenterology

## 2018-05-17 DIAGNOSIS — H5213 Myopia, bilateral: Secondary | ICD-10-CM | POA: Insufficient documentation

## 2018-06-04 DIAGNOSIS — Z87891 Personal history of nicotine dependence: Secondary | ICD-10-CM | POA: Insufficient documentation

## 2018-07-09 ENCOUNTER — Other Ambulatory Visit: Payer: Self-pay | Admitting: Family Medicine

## 2018-07-09 ENCOUNTER — Ambulatory Visit
Admission: RE | Admit: 2018-07-09 | Discharge: 2018-07-09 | Disposition: A | Discharge status: Home / Self Care | Payer: Self-pay | Attending: Family Medicine | Admitting: Family Medicine

## 2018-07-09 ENCOUNTER — Ambulatory Visit
Admission: RE | Admit: 2018-07-09 | Discharge: 2018-07-09 | Disposition: A | Discharge status: Home / Self Care | Payer: Self-pay | Source: Ambulatory Visit | Attending: Family Medicine | Admitting: Family Medicine

## 2018-07-09 DIAGNOSIS — R042 Hemoptysis: Secondary | ICD-10-CM

## 2018-07-14 ENCOUNTER — Other Ambulatory Visit: Payer: Self-pay

## 2018-07-14 ENCOUNTER — Encounter: Payer: Self-pay | Admitting: Emergency Medicine

## 2018-07-14 ENCOUNTER — Emergency Department
Admission: EM | Admit: 2018-07-14 | Discharge: 2018-07-14 | Disposition: A | Discharge status: Home / Self Care | Payer: Self-pay | Attending: Emergency Medicine | Admitting: Emergency Medicine

## 2018-07-14 ENCOUNTER — Emergency Department: Payer: Self-pay

## 2018-07-14 DIAGNOSIS — R110 Nausea: Secondary | ICD-10-CM | POA: Insufficient documentation

## 2018-07-14 DIAGNOSIS — J45909 Unspecified asthma, uncomplicated: Secondary | ICD-10-CM | POA: Insufficient documentation

## 2018-07-14 DIAGNOSIS — Z91010 Allergy to peanuts: Secondary | ICD-10-CM | POA: Insufficient documentation

## 2018-07-14 DIAGNOSIS — Z79899 Other long term (current) drug therapy: Secondary | ICD-10-CM | POA: Insufficient documentation

## 2018-07-14 DIAGNOSIS — R630 Anorexia: Secondary | ICD-10-CM | POA: Insufficient documentation

## 2018-07-14 DIAGNOSIS — R079 Chest pain, unspecified: Secondary | ICD-10-CM | POA: Insufficient documentation

## 2018-07-14 DIAGNOSIS — Z7984 Long term (current) use of oral hypoglycemic drugs: Secondary | ICD-10-CM | POA: Insufficient documentation

## 2018-07-14 DIAGNOSIS — R05 Cough: Secondary | ICD-10-CM | POA: Insufficient documentation

## 2018-07-14 DIAGNOSIS — I10 Essential (primary) hypertension: Secondary | ICD-10-CM | POA: Insufficient documentation

## 2018-07-14 DIAGNOSIS — F17200 Nicotine dependence, unspecified, uncomplicated: Secondary | ICD-10-CM | POA: Insufficient documentation

## 2018-07-14 LAB — HEPATIC FUNCTION PANEL
ALT: 22 U/L (ref 0–44)
AST: 20 U/L (ref 15–41)
Albumin: 3.9 g/dL (ref 3.5–5.0)
Alkaline Phosphatase: 57 U/L (ref 38–126)
Total Bilirubin: 0.4 mg/dL (ref 0.3–1.2)
Total Protein: 7.3 g/dL (ref 6.5–8.1)

## 2018-07-14 LAB — CBC
HCT: 38.1 % (ref 36.0–46.0)
Hemoglobin: 12 g/dL (ref 12.0–15.0)
MCH: 25 pg — ABNORMAL LOW (ref 26.0–34.0)
MCHC: 31.5 g/dL (ref 30.0–36.0)
MCV: 79.4 fL — ABNORMAL LOW (ref 80.0–100.0)
NRBC: 0 % (ref 0.0–0.2)
Platelets: 377 10*3/uL (ref 150–400)
RBC: 4.8 MIL/uL (ref 3.87–5.11)
RDW: 13.6 % (ref 11.5–15.5)
WBC: 4.7 10*3/uL (ref 4.0–10.5)

## 2018-07-14 LAB — BASIC METABOLIC PANEL
Anion gap: 6 (ref 5–15)
BUN: 10 mg/dL (ref 6–20)
CO2: 26 mmol/L (ref 22–32)
Calcium: 8.7 mg/dL — ABNORMAL LOW (ref 8.9–10.3)
Chloride: 105 mmol/L (ref 98–111)
Creatinine, Ser: 0.87 mg/dL (ref 0.44–1.00)
Glucose, Bld: 94 mg/dL (ref 70–99)
POTASSIUM: 3.6 mmol/L (ref 3.5–5.1)
SODIUM: 137 mmol/L (ref 135–145)

## 2018-07-14 LAB — TROPONIN I

## 2018-07-14 LAB — LIPASE, BLOOD: Lipase: 28 U/L (ref 11–51)

## 2018-07-14 LAB — POCT PREGNANCY, URINE: PREG TEST UR: NEGATIVE

## 2018-07-14 MED ORDER — ALUM & MAG HYDROXIDE-SIMETH 400-400-40 MG/5ML PO SUSP: 5.0000 mL | Freq: Four times a day (QID) | ORAL | 0 refills | Status: AC | PRN

## 2018-07-14 MED ORDER — IPRATROPIUM-ALBUTEROL 0.5-2.5 (3) MG/3ML IN SOLN
3.0000 mL | Freq: Once | RESPIRATORY_TRACT | Status: AC
  Administered 2018-07-14: 3 mL via RESPIRATORY_TRACT
  Filled 2018-07-14: qty 3

## 2018-07-14 NOTE — ED Notes (Signed)
Pt given crackers.  

## 2018-07-14 NOTE — ED Triage Notes (Signed)
First Nurse Note:  C/O right sided chest pain x 1 week.  States pain radiates toward right shoulder.  States seen by PCP earlier this week for same and CXR was negative.  Patient is AAOx3.  Skin warm and dry. No SOB/ DOE. NAD

## 2018-07-14 NOTE — ED Provider Notes (Signed)
[facility] Emergency Department Provider Note  ____________________________________________  Time seen: Approximately 2:49 PM  I have reviewed the triage vital signs and the nursing notes.   HISTORY  Chief Complaint Chest Pain   HPI Joann Miller is a 22 y.o. female with a history of asthma, GERD, hiatal hernia, esophagitis, obesity who presents for evaluation of chest pain.  Patient reports 7 to 10 days of constant tightness located in the center of her chest.  No pleuritic chest pain, no shortness of breath.  She has had a dry cough.  No fever or chills.  No abdominal pain.  Patient reports that the pain radiates to her back.  She reports nausea and decreased appetite.  She reports that the pain is worse after she eats.  She recently had an endoscopy showing esophagitis and a hiatal hernia.  She reports that her symptoms started before the endoscopy.  She is currently on omeprazole 40 mg 3 times daily.  She denies melena, hematochezia, coffee-ground emesis, or hematemesis.  The pain is not worse with exertion.  The pain is currently moderate in intensity.  She denies personal family history of blood clots, recent travel immobilization, leg pain or swelling, hemoptysis, exogenous hormones, or history of cancer.  Past Medical History:  Diagnosis Date  . Asthma   . Migraine   . Murmur   . Obesity   . Sciatic leg pain   . Syncope   . Tachycardia     Patient Active Problem List   Diagnosis Date Noted  . 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 Essential hypertension 11/07/2017  . Hypercholesterolemia with LDL greater than 190 mg/dL 16/10/960410/25/2017  . Chronic insomnia 04/07/2015  . Morbid obesity due to excess calories (HCC) 10/29/2014  . Acute chest pain 08/24/2014  . 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 Intractable migraine without aura and without status migrainosus 08/24/2014  . Syncope and collapse 08/24/2014  .  Seizure-like activity (HCC) 04/20/2014  . Contraception management 03/25/2014  . Depression 10/27/2013  . Suicide attempt (HCC) 08/07/2013  . Insulin resistance 06/03/2013  . Amenorrhea 02/06/2013  . Constipation 11/22/2012  . Seasonal allergies 11/22/2012  . Hidradenitis suppurativa 12/27/2011    Past Surgical History:  Procedure Laterality Date  . ESOPHAGOGASTRODUODENOSCOPY (EGD) WITH PROPOFOL N/A 11/27/2017   Procedure: ESOPHAGOGASTRODUODENOSCOPY (EGD) WITH PROPOFOL;  Surgeon: Toney ReilVanga, Rohini Reddy, MD;  Location: [facility] ENDOSCOPY;  Service: Gastroenterology;  Laterality: N/A;  . FOREIGN BODY REMOVAL EAR Right 05/10/2015   Procedure: REMOVAL FOREIGN BODY EAR;  Surgeon: Vernie MurdersPaul Juengel, MD;  Location: [facility] ORS;  Service: ENT;  Laterality: Right;  . KELOID EXCISION    . MINOR EXCISION EAR CANAL MASS Right 05/10/2015   Procedure: MINOR EXCISION EAR CANAL MASS/EXCISION KELOID AND ADVANCEMENT FLAP REPAIR;  Surgeon: Vernie MurdersPaul Juengel, MD;  Location: [facility] ORS;  Service: ENT;  Laterality: Right;    Prior to Admission medications   Medication Sig Start Date End Date Taking? Authorizing Provider  albuterol (PROVENTIL HFA;VENTOLIN HFA) 108 (90 Base) MCG/ACT inhaler Inhale 2 puffs into the lungs every 6 (six) hours as needed for wheezing or shortness of breath. 04/07/18   Sharyn CreamerQuale, Mark, MD  alum & mag hydroxide-simeth (MAALOX MAX) 400-400-40 MG/5ML suspension Take 5 mLs by mouth every 6 (six) hours as needed for indigestion. 07/14/18   Nita SickleVeronese, [place], MD  butalbital-acetaminophen-caffeine (FIORICET, ESGIC) [phone] MG tablet Take 1 tablet by mouth 3 (three) times daily as needed for headache. Patient not taking: Reported on  04/07/2018 03/09/18 03/09/19  Enid Derry, PA-C  DULoxetine (CYMBALTA) 30 MG capsule Take 30 mg by mouth daily.  12/13/17 12/13/18  [provider]  Erenumab-aooe (AIMOVIG) 70 MG/ML SOAJ Inject 70 mg into the skin every 28 (twenty-eight) days.    [provider]    Fluticasone-Salmeterol (ADVAIR DISKUS) 250-50 MCG/DOSE AEPB Inhale 1 puff into the lungs daily.     [provider]  hydrochlorothiazide (HYDRODIURIL) 25 MG tablet Take 25 mg by mouth daily.  12/10/17 12/10/18  [provider]  metFORMIN (GLUCOPHAGE) 500 MG tablet Take 500 mg by mouth daily.  11/07/17   [provider]  montelukast (SINGULAIR) 10 MG tablet Take 10 mg by mouth at bedtime.    [provider]  Multiple Vitamin (MULTI-VITAMINS) TABS Take 1 tablet by mouth daily.     [provider]  omeprazole (PRILOSEC) 40 MG capsule Take 1 capsule (40 mg total) by mouth 2 (two) times daily before a meal. 12/31/17 04/07/18  Vanga, Loel Dubonnet, MD  predniSONE (DELTASONE) 20 MG tablet Take 2 tablets (40 mg total) by mouth daily with breakfast. 04/07/18   Sharyn Creamer, MD    Allergies Atorvastatin; Peanut oil; Shellfish allergy; Simvastatin; Cat hair extract; Pollen extract; and Penicillins  No family history on file.  Social History Social History   Tobacco Use  . Smoking status: Current Some Day Smoker    Packs/day: 0.20  . Smokeless tobacco: Never Used  Substance Use Topics  . Alcohol use: Yes    Comment: occasionally  . Drug use: No    Review of Systems  Constitutional: Negative for fever. Eyes: Negative for visual changes. ENT: Negative for sore throat. Neck: No neck pain  Cardiovascular: + chest tightness. Respiratory: Negative for shortness of breath. Gastrointestinal: Negative for abdominal pain, vomiting or diarrhea. + nausea Genitourinary: Negative for dysuria. Musculoskeletal: Negative for back pain. Skin: Negative for rash. Neurological: Negative for headaches, weakness or numbness. Psych: No SI or HI  ____________________________________________   PHYSICAL EXAM:  VITAL SIGNS: ED Triage Vitals [07/14/18 1118]  Enc Vitals Group     BP 134/86     Pulse Rate 100     Resp 16     Temp 99 F (37.2 C)     Temp Source Oral      SpO2 97 %     Weight (!) 369 lb 11.2 oz (167.7 kg)     Height 5\' 5"  (1.651 m)     Head Circumference      Peak Flow      Pain Score 8     Pain Loc      Pain Edu?      Excl. in GC?     Constitutional: Alert and oriented. Well appearing and in no apparent distress. HEENT:      Head: Normocephalic and atraumatic.         Eyes: Conjunctivae are normal. Sclera is non-icteric.       Mouth/Throat: Mucous membranes are moist.       Neck: Supple with no signs of meningismus. Cardiovascular: Regular rate and rhythm. No murmurs, gallops, or rubs. 2+ symmetrical distal pulses are present in all extremities. No JVD. Respiratory: Normal respiratory effort. Lungs are clear to auscultation bilaterally. No wheezes, crackles, or rhonchi.  Gastrointestinal: Soft, non tender, and non distended with positive bowel sounds. No rebound or guarding. Genitourinary: No CVA tenderness. Musculoskeletal: Nontender with normal range of motion in all extremities. No edema, cyanosis, or erythema of extremities. Neurologic:  Normal speech and language. Face is symmetric. Moving all extremities. No gross focal neurologic deficits are appreciated. Skin: Skin is warm, dry and intact. No rash noted. Psychiatric: Mood and affect are normal. Speech and behavior are normal.  ____________________________________________   LABS (all labs ordered are listed, but only abnormal results are displayed)  Labs Reviewed  BASIC METABOLIC PANEL - Abnormal; Notable for the following components:      Result Value   Calcium 8.7 (*)    All other components within normal limits  CBC - Abnormal; Notable for the following components:   MCV 79.4 (*)    MCH 25.0 (*)    All other components within normal limits  TROPONIN I  HEPATIC FUNCTION PANEL  LIPASE, BLOOD  POC URINE PREG, ED  POCT PREGNANCY, URINE   ____________________________________________  EKG  ED ECG REPORT I, Nita Sickle, the attending physician, personally  viewed and interpreted this ECG.  Normal sinus rhythm, rate of 92, normal intervals, normal axis, T wave inversions in inferior and lateral leads with no ST elevations or depressions.  No significant changes when compared to prior. ____________________________________________  RADIOLOGY  I have personally reviewed the images performed during this visit and I agree with the Radiologist's read.   Interpretation by Radiologist:  Dg Chest 2 View  Result Date: 07/14/2018 CLINICAL DATA:  Chest pain. EXAM: CHEST - 2 VIEW COMPARISON:  July 09, 2017 FINDINGS: The heart size and mediastinal contours are within normal limits. Both lungs are clear. The visualized skeletal structures are unremarkable. IMPRESSION: No active cardiopulmonary disease. Electronically Signed   By: Gerome Sam III M.D   On: 07/14/2018 11:51     ____________________________________________   PROCEDURES  Procedure(s) performed: None Procedures Critical Care performed:  None ____________________________________________   INITIAL IMPRESSION / ASSESSMENT AND PLAN / ED COURSE   22 y.o. female with a history of asthma, GERD, hiatal hernia, esophagitis, obesity who presents for evaluation of chest tightness worse when she eats, associated with nausea and decreased appetite.  Patient is extremely well-appearing, no distress, normal work of breathing, normal sats, normal vital signs, lungs are clear to auscultation.  No asymmetric edema of the lower extremities.  Patient with recent endoscopy showing esophagitis and hiatal hernia which I believe is most likely the culprit of her symptoms.  She is already maxed out on omeprazole 40 mg 3 times daily.  Will provide Maalox.  Chest x-ray showing no evidence of pneumothorax or pneumonia, normal mediastinal silhouette.  Labs including troponin, CMP, lipase, and CBC WNL. No abdominal tenderness on palpation. Ddx GERD, PUD, esophagitis, hiatal hernia, pancreatitis, GB disease. With  normal labs and no abdominal tenderness, no indication for imaging at this time. PERC negative for PE. Low suspicion for ACS with constant pain for 7 days and unchanged EKG and normal troponin. Since patient has a cough, bronchospasm/asthma exacerbation was also possibility.  Patient was given a DuoNeb with no changes in her tightness, therefore less likely bronchitis. Recommended close f/u with GI doctor if pain persists and PCP for further evaluation.       As part of my medical decision making, I reviewed the following data within the electronic MEDICAL RECORD NUMBER Nursing notes reviewed and incorporated, Labs reviewed , EKG interpreted , Old EKG reviewed, Old chart reviewed, Radiograph reviewed , Notes from prior ED visits and [place] Controlled Substance Database    Pertinent labs & imaging results that were available during my care of the patient were reviewed by me and considered  in my medical decision making (see chart for details).    ____________________________________________   FINAL CLINICAL IMPRESSION(S) / ED DIAGNOSES  Final diagnoses:  Chest pain, unspecified type      NEW MEDICATIONS STARTED DURING THIS VISIT:  ED Discharge Orders         Ordered    alum & mag hydroxide-simeth (MAALOX MAX) 400-400-40 MG/5ML suspension  Every 6 hours PRN     07/14/18 1501           Note:  This document was prepared using Dragon voice recognition software and may include unintentional dictation errors.    Nita Sickle, MD 07/14/18 [phone]

## 2018-07-14 NOTE — ED Notes (Signed)
Pt states she feels the same after the breathing treatment  Provided apple juice w/ dr. Don Perkingveronese permission

## 2018-07-14 NOTE — ED Triage Notes (Signed)
Pt to ED via POV c/o central chest pain x 1-2 weeks. Pt states that the pain radiates into her back. Pt states that she has had some nausea and loss of appetite. Pt denies shortness of breath or vomiting . Pt states that the pain is a little worse after eating. Pt is in NAD at this time.

## 2018-07-14 NOTE — Discharge Instructions (Addendum)

## 2018-07-16 ENCOUNTER — Ambulatory Visit: Payer: Self-pay | Admitting: Gastroenterology

## 2018-09-02 DIAGNOSIS — K449 Diaphragmatic hernia without obstruction or gangrene: Secondary | ICD-10-CM | POA: Insufficient documentation

## 2018-09-15 ENCOUNTER — Emergency Department: Payer: BLUE CROSS/BLUE SHIELD

## 2018-09-15 ENCOUNTER — Emergency Department
Admission: EM | Admit: 2018-09-15 | Discharge: 2018-09-15 | Disposition: A | Discharge status: Home / Self Care | Payer: BLUE CROSS/BLUE SHIELD | Attending: Emergency Medicine | Admitting: Emergency Medicine

## 2018-09-15 ENCOUNTER — Other Ambulatory Visit: Payer: Self-pay

## 2018-09-15 DIAGNOSIS — F17210 Nicotine dependence, cigarettes, uncomplicated: Secondary | ICD-10-CM | POA: Insufficient documentation

## 2018-09-15 DIAGNOSIS — M79605 Pain in left leg: Secondary | ICD-10-CM | POA: Insufficient documentation

## 2018-09-15 DIAGNOSIS — Z79899 Other long term (current) drug therapy: Secondary | ICD-10-CM | POA: Insufficient documentation

## 2018-09-15 DIAGNOSIS — J45909 Unspecified asthma, uncomplicated: Secondary | ICD-10-CM | POA: Diagnosis not present

## 2018-09-15 DIAGNOSIS — I10 Essential (primary) hypertension: Secondary | ICD-10-CM | POA: Insufficient documentation

## 2018-09-15 DIAGNOSIS — Z91010 Allergy to peanuts: Secondary | ICD-10-CM | POA: Diagnosis not present

## 2018-09-15 HISTORY — DX: Migraine, unspecified, not intractable, without status migrainosus: G43.909

## 2018-09-15 MED ORDER — PREDNISONE 10 MG (21) PO TBPK: ORAL_TABLET | ORAL | 0 refills | Status: DC

## 2018-09-15 MED ORDER — GABAPENTIN 300 MG PO CAPS: 300.0000 mg | ORAL_CAPSULE | Freq: Three times a day (TID) | ORAL | 0 refills | Status: AC

## 2018-09-15 MED ORDER — GABAPENTIN 300 MG PO CAPS
300.0000 mg | ORAL_CAPSULE | Freq: Once | ORAL | Status: AC
  Administered 2018-09-15: 300 mg via ORAL
  Filled 2018-09-15: qty 1

## 2018-09-15 MED ORDER — PREDNISONE 20 MG PO TABS
60.0000 mg | ORAL_TABLET | Freq: Once | ORAL | Status: AC
  Administered 2018-09-15: 60 mg via ORAL
  Filled 2018-09-15: qty 3

## 2018-09-15 NOTE — ED Triage Notes (Signed)
Pt states L leg pain since Monday when she had a spinal tap for migraines. States leg is swollen and has been intermittently tingling. Fell in shower yesterday. A&O, in wheelchair.

## 2018-09-15 NOTE — ED Notes (Signed)
Pt states that she has had some numbness and tingling down her left side since she had a spinal tap done on Monday.

## 2018-09-15 NOTE — ED Provider Notes (Signed)
[facility] Emergency Department Provider Note   ____________________________________________   I have reviewed the triage vital signs and the nursing notes.   HISTORY  Chief Complaint Leg Pain   History limited by: Not Limited   HPI Joann Miller is a 23 y.o. female who presents to the emergency department today because of concerns for left leg pain.  The pain started while she was getting a spinal tap 6 days ago.  Since that time the pain is been persistent.  It does go up and down her whole left leg.  It has made it hard for her to walk.  In addition she feels like she has had some swelling.  She denies any recent trauma to the leg.  Denies similar symptoms in the past.  No fevers. Denies any change in bladder or bowel function.   Per medical record review patient has a history of migraines, recent lumbar puncture.  Past Medical History:  Diagnosis Date  . Asthma   . Migraine   . Migraines   . Murmur   . Obesity   . Sciatic leg pain   . Syncope   . Tachycardia     Patient Active Problem List   Diagnosis Date Noted  . 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 Essential hypertension 11/07/2017  . Hypercholesterolemia with LDL greater than 190 mg/dL 88/33/7445  . Chronic insomnia 04/07/2015  . Morbid obesity due to excess calories (HCC) 10/29/2014  . Acute chest pain 08/24/2014  . 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 Intractable migraine without aura and without status migrainosus 08/24/2014  . Syncope and collapse 08/24/2014  . Seizure-like activity (HCC) 04/20/2014  . Contraception management 03/25/2014  . Depression 10/27/2013  . Suicide attempt (HCC) 08/07/2013  . Insulin resistance 06/03/2013  . Amenorrhea 02/06/2013  . Constipation 11/22/2012  . Seasonal allergies 11/22/2012  . Hidradenitis suppurativa 12/27/2011    Past Surgical History:  Procedure Laterality Date  .  ESOPHAGOGASTRODUODENOSCOPY (EGD) WITH PROPOFOL N/A 11/27/2017   Procedure: ESOPHAGOGASTRODUODENOSCOPY (EGD) WITH PROPOFOL;  Surgeon: Toney Reil, MD;  Location: [facility] ENDOSCOPY;  Service: Gastroenterology;  Laterality: N/A;  . FOREIGN BODY REMOVAL EAR Right 05/10/2015   Procedure: REMOVAL FOREIGN BODY EAR;  Surgeon: Vernie Murders, MD;  Location: [facility] ORS;  Service: ENT;  Laterality: Right;  . KELOID EXCISION    . MINOR EXCISION EAR CANAL MASS Right 05/10/2015   Procedure: MINOR EXCISION EAR CANAL MASS/EXCISION KELOID AND ADVANCEMENT FLAP REPAIR;  Surgeon: Vernie Murders, MD;  Location: [facility] ORS;  Service: ENT;  Laterality: Right;    Prior to Admission medications   Medication Sig Start Date End Date Taking? Authorizing Provider  albuterol (PROVENTIL HFA;VENTOLIN HFA) 108 (90 Base) MCG/ACT inhaler Inhale 2 puffs into the lungs every 6 (six) hours as needed for wheezing or shortness of breath. 04/07/18   Sharyn Creamer, MD  alum & mag hydroxide-simeth (MAALOX MAX) 400-400-40 MG/5ML suspension Take 5 mLs by mouth every 6 (six) hours as needed for indigestion. 07/14/18   Nita Sickle, MD  butalbital-acetaminophen-caffeine (FIORICET, ESGIC) [phone] MG tablet Take 1 tablet by mouth 3 (three) times daily as needed for headache. Patient not taking: Reported on 04/07/2018 03/09/18 03/09/19  Enid Derry, PA-C  DULoxetine (CYMBALTA) 30 MG capsule Take 30 mg by mouth daily.  12/13/17 12/13/18  [provider]  Erenumab-aooe (AIMOVIG) 70 MG/ML SOAJ Inject 70 mg into the skin every 28 (twenty-eight) days.    [provider]  Fluticasone-Salmeterol (ADVAIR  DISKUS) 250-50 MCG/DOSE AEPB Inhale 1 puff into the lungs daily.     [provider]  hydrochlorothiazide (HYDRODIURIL) 25 MG tablet Take 25 mg by mouth daily.  12/10/17 12/10/18  [provider]  metFORMIN (GLUCOPHAGE) 500 MG tablet Take 500 mg by mouth daily.  11/07/17   [provider]  montelukast (SINGULAIR) 10  MG tablet Take 10 mg by mouth at bedtime.    [provider]  Multiple Vitamin (MULTI-VITAMINS) TABS Take 1 tablet by mouth daily.     [provider]  omeprazole (PRILOSEC) 40 MG capsule Take 1 capsule (40 mg total) by mouth 2 (two) times daily before a meal. 12/31/17 04/07/18  Vanga, Loel Dubonnet, MD  predniSONE (DELTASONE) 20 MG tablet Take 2 tablets (40 mg total) by mouth daily with breakfast. 04/07/18   Sharyn Creamer, MD    Allergies Atorvastatin; Peanut oil; Shellfish allergy; Simvastatin; Cat hair extract; Pollen extract; and Penicillins  History reviewed. No pertinent family history.  Social History Social History   Tobacco Use  . Smoking status: Current Some Day Smoker    Packs/day: 0.20  . Smokeless tobacco: Never Used  Substance Use Topics  . Alcohol use: Yes    Comment: occasionally  . Drug use: No    Review of Systems Constitutional: No fever/chills Eyes: No visual changes. ENT: No sore throat. Cardiovascular: Denies chest pain. Respiratory: Denies shortness of breath. Gastrointestinal: No abdominal pain.  No nausea, no vomiting.  No diarrhea.   Genitourinary: Negative for dysuria. Musculoskeletal: Positive for left leg pain. Skin: Negative for rash. Neurological: Negative for headaches, focal weakness or numbness.  ____________________________________________   PHYSICAL EXAM:  VITAL SIGNS: ED Triage Vitals [09/15/18 1422]  Enc Vitals Group     BP 132/81     Pulse Rate 83     Resp 16     Temp 98.7 F (37.1 C)     Temp Source Oral     SpO2 100 %     Weight (!) 361 lb (163.7 kg)     Height 5\' 5"  (1.651 m)     Head Circumference      Peak Flow      Pain Score 7   Constitutional: Alert and oriented.  Eyes: Conjunctivae are normal.  ENT      Head: Normocephalic and atraumatic.      Nose: No congestion/rhinnorhea.      Mouth/Throat: Mucous membranes are moist.      Neck: No stridor. Hematological/Lymphatic/Immunilogical: No cervical  lymphadenopathy. Cardiovascular: Normal rate, regular rhythm.  No murmurs, rubs, or gallops.  Respiratory: Normal respiratory effort without tachypnea nor retractions. Breath sounds are clear and equal bilaterally. No wheezes/rales/rhonchi. Gastrointestinal: Soft and non tender. No rebound. No guarding.  Genitourinary: Deferred Musculoskeletal: No appreciable swelling to the left leg. DP 2+ in bilateral lower extremities. No erythema. Extremely sensitive to touch on the left leg.  Neurologic:  Normal speech and language. No gross focal neurologic deficits are appreciated.  Skin:  Skin is warm, dry and intact. No rash noted. Psychiatric: Mood and affect are normal. Speech and behavior are normal. Patient exhibits appropriate insight and judgment.  ____________________________________________    LABS (pertinent positives/negatives)  None  ____________________________________________   EKG  None  ____________________________________________    RADIOLOGY  [place] left lower extremity No DVT  ____________________________________________   PROCEDURES  Procedures  ____________________________________________   INITIAL IMPRESSION / ASSESSMENT AND PLAN / ED COURSE  Pertinent labs & imaging results that were available during my  care of the patient were reviewed by me and considered in my medical decision making (see chart for details).   Patient presents to the emergency department today because of concerns for left leg pain that started while she underwent a recent lumbar puncture.  Was extremely tender to palpation of the skin of her left leg.  Good arterial pulses.  I did not appreciate any swelling.  Ultrasound did not show any blood clots.  This point I think radiculopathy likely secondary to lumbar puncture.  Discussed this with the patient.  Will start patient on steroids and gabapentin.  Discussed with patient importance of following up with physician who performed the  procedure.  At this point I doubt hematoma or abscess causing the symptoms given lack of bladder or bowel issues as well as the fact that the pain started during the lumbar puncture and was not delayed.  ____________________________________________   FINAL CLINICAL IMPRESSION(S) / ED DIAGNOSES  Final diagnoses:  Left leg pain     Note: This dictation was prepared with Dragon dictation. Any transcriptional errors that result from this process are unintentional     Phineas SemenGoodman, Tamyrah Burbage, MD 09/15/18 1705

## 2018-09-15 NOTE — Discharge Instructions (Addendum)
Please seek medical attention for any high fevers, chest pain, shortness of breath, change in behavior, persistent vomiting, bloody stool or any other new or concerning symptoms.  

## 2018-12-18 DIAGNOSIS — M5416 Radiculopathy, lumbar region: Secondary | ICD-10-CM | POA: Insufficient documentation

## 2018-12-18 DIAGNOSIS — M545 Low back pain, unspecified: Secondary | ICD-10-CM | POA: Insufficient documentation

## 2019-05-23 ENCOUNTER — Emergency Department: Payer: BLUE CROSS/BLUE SHIELD

## 2019-05-23 ENCOUNTER — Other Ambulatory Visit: Payer: Self-pay

## 2019-05-23 ENCOUNTER — Emergency Department
Admission: EM | Admit: 2019-05-23 | Discharge: 2019-05-24 | Disposition: A | Discharge status: Home / Self Care | Payer: BLUE CROSS/BLUE SHIELD | Attending: Emergency Medicine | Admitting: Emergency Medicine

## 2019-05-23 ENCOUNTER — Encounter: Payer: Self-pay | Admitting: Intensive Care

## 2019-05-23 DIAGNOSIS — I10 Essential (primary) hypertension: Secondary | ICD-10-CM | POA: Diagnosis not present

## 2019-05-23 DIAGNOSIS — Z79899 Other long term (current) drug therapy: Secondary | ICD-10-CM | POA: Insufficient documentation

## 2019-05-23 DIAGNOSIS — Z87891 Personal history of nicotine dependence: Secondary | ICD-10-CM | POA: Insufficient documentation

## 2019-05-23 DIAGNOSIS — J45909 Unspecified asthma, uncomplicated: Secondary | ICD-10-CM | POA: Diagnosis not present

## 2019-05-23 DIAGNOSIS — R1010 Upper abdominal pain, unspecified: Secondary | ICD-10-CM | POA: Diagnosis not present

## 2019-05-23 DIAGNOSIS — L0390 Cellulitis, unspecified: Secondary | ICD-10-CM | POA: Insufficient documentation

## 2019-05-23 DIAGNOSIS — R109 Unspecified abdominal pain: Secondary | ICD-10-CM | POA: Diagnosis present

## 2019-05-23 HISTORY — DX: Essential (primary) hypertension: I10

## 2019-05-23 LAB — COMPREHENSIVE METABOLIC PANEL
ALT: 25 U/L (ref 0–44)
AST: 17 U/L (ref 15–41)
Albumin: 4.3 g/dL (ref 3.5–5.0)
Alkaline Phosphatase: 58 U/L (ref 38–126)
Anion gap: 10 (ref 5–15)
BUN: 13 mg/dL (ref 6–20)
CO2: 26 mmol/L (ref 22–32)
Calcium: 9.3 mg/dL (ref 8.9–10.3)
Chloride: 103 mmol/L (ref 98–111)
Creatinine, Ser: 1 mg/dL (ref 0.44–1.00)
GFR calc Af Amer: >60 mL/min (ref >60)
GFR calc non Af Amer: >60 mL/min (ref >60)
Glucose, Bld: 93 mg/dL (ref 70–99)
Potassium: 4 mmol/L (ref 3.5–5.1)
Sodium: 139 mmol/L (ref 135–145)
Total Bilirubin: 0.6 mg/dL (ref 0.3–1.2)
Total Protein: 8.1 g/dL (ref 6.5–8.1)

## 2019-05-23 LAB — CBC
HCT: 35.9 % — ABNORMAL LOW (ref 36.0–46.0)
Hemoglobin: 11.3 g/dL — ABNORMAL LOW (ref 12.0–15.0)
MCH: 24.6 pg — ABNORMAL LOW (ref 26.0–34.0)
MCHC: 31.5 g/dL (ref 30.0–36.0)
MCV: 78 fL — ABNORMAL LOW (ref 80.0–100.0)
Platelets: 442 10*3/uL — ABNORMAL HIGH (ref 150–400)
RBC: 4.6 MIL/uL (ref 3.87–5.11)
RDW: 15 % (ref 11.5–15.5)
WBC: 11.6 10*3/uL — ABNORMAL HIGH (ref 4.0–10.5)
nRBC: 0 % (ref 0.0–0.2)

## 2019-05-23 LAB — LIPASE, BLOOD: Lipase: 27 U/L (ref 11–51)

## 2019-05-23 LAB — URINALYSIS, COMPLETE (UACMP) WITH MICROSCOPIC
Bacteria, UA: NONE SEEN
Bilirubin Urine: NEGATIVE
Glucose, UA: NEGATIVE mg/dL
Hgb urine dipstick: NEGATIVE
Ketones, ur: NEGATIVE mg/dL
Leukocytes,Ua: NEGATIVE
Nitrite: NEGATIVE
Protein, ur: NEGATIVE mg/dL
Specific Gravity, Urine: 1.026 (ref 1.005–1.030)
Squamous Epithelial / HPF: NONE SEEN (ref 0–5)
pH: 6 (ref 5.0–8.0)

## 2019-05-23 LAB — PREGNANCY, URINE: Preg Test, Ur: NEGATIVE

## 2019-05-23 MED ORDER — IOHEXOL 9 MG/ML PO SOLN
500.0000 mL | Freq: Once | ORAL | Status: DC | PRN
  Administered 2019-05-23: 22:00:00 500 mL via ORAL
  Filled 2019-05-23: qty 500

## 2019-05-23 MED ORDER — CEPHALEXIN 500 MG PO CAPS: 500.0000 mg | ORAL_CAPSULE | Freq: Four times a day (QID) | ORAL | 0 refills | Status: AC

## 2019-05-23 MED ORDER — IOHEXOL 300 MG/ML  SOLN
125.0000 mL | Freq: Once | INTRAMUSCULAR | Status: AC | PRN
  Administered 2019-05-23: 23:00:00 125 mL via INTRAVENOUS

## 2019-05-23 MED ORDER — SODIUM CHLORIDE 0.9 % IV BOLUS
1000.0000 mL | Freq: Once | INTRAVENOUS | Status: AC
  Administered 2019-05-23: 1000 mL via INTRAVENOUS

## 2019-05-23 MED ORDER — KETOROLAC TROMETHAMINE 30 MG/ML IJ SOLN
30.0000 mg | Freq: Once | INTRAMUSCULAR | Status: AC
  Administered 2019-05-23: 22:00:00 30 mg via INTRAVENOUS
  Filled 2019-05-23: qty 1

## 2019-05-23 MED ORDER — ONDANSETRON HCL 4 MG/2ML IJ SOLN
4.0000 mg | Freq: Once | INTRAMUSCULAR | Status: AC
  Administered 2019-05-23: 4 mg via INTRAVENOUS
  Filled 2019-05-23: qty 2

## 2019-05-23 NOTE — ED Notes (Signed)
Dr. Cherylann Banas in to see pt.

## 2019-05-23 NOTE — ED Notes (Signed)
Patient transported to CT 

## 2019-05-23 NOTE — ED Notes (Addendum)
Pt states has a "boil" to left upper thigh and labia. MD notified and in to chaperone pt with exam.

## 2019-05-23 NOTE — ED Triage Notes (Signed)
Patient c/o abd pain with N/V/intermittent diarrhea since Tuesday. Denies trouble urinating

## 2019-05-23 NOTE — ED Notes (Signed)
Additional warm blankets provided. Pt has finished po contrast, ct notified by linda ed Network engineer.

## 2019-05-23 NOTE — ED Provider Notes (Signed)
[facility] Emergency Department Provider Note ____________________________________________   First MD Initiated Contact with Patient 05/23/19 2129     (approximate)  I have reviewed the triage vital signs and the nursing notes.   HISTORY  Chief Complaint Abdominal Pain    HPI Joann Miller is a 23 y.o. female with PMH as noted below who presents with upper abdominal pain, bilateral but somewhat worse on the left, and associated with nausea and multiple episodes of vomiting.  It has lasted for about a week.  The patient states that she has not been able to hold anything down.  She reports alternating constipation and diarrhea.  She denies any urinary symptoms.  The patient reports that she had experienced right upper quadrant pain for some time which she attributed to a hiatal hernia, but the pain on the left side is new.  Past Medical History:  Diagnosis Date  . Asthma   . Hypertension   . Migraine   . Migraines   . Murmur   . Obesity   . Sciatic leg pain   . Syncope   . Tachycardia     Patient Active Problem List   Diagnosis Date Noted  . 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 Essential hypertension 11/07/2017  . Hypercholesterolemia with LDL greater than 190 mg/dL 50/27/7412  . Chronic insomnia 04/07/2015  . Morbid obesity due to excess calories (HCC) 10/29/2014  . Acute chest pain 08/24/2014  . 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 Intractable migraine without aura and without status migrainosus 08/24/2014  . Syncope and collapse 08/24/2014  . Seizure-like activity (HCC) 04/20/2014  . Contraception management 03/25/2014  . Depression 10/27/2013  . Suicide attempt (HCC) 08/07/2013  . Insulin resistance 06/03/2013  . Amenorrhea 02/06/2013  . Constipation 11/22/2012  . Seasonal allergies 11/22/2012  . Hidradenitis suppurativa 12/27/2011    Past Surgical History:  Procedure Laterality Date  .  ESOPHAGOGASTRODUODENOSCOPY (EGD) WITH PROPOFOL N/A 11/27/2017   Procedure: ESOPHAGOGASTRODUODENOSCOPY (EGD) WITH PROPOFOL;  Surgeon: Toney Reil, MD;  Location: [facility] ENDOSCOPY;  Service: Gastroenterology;  Laterality: N/A;  . FOREIGN BODY REMOVAL EAR Right 05/10/2015   Procedure: REMOVAL FOREIGN BODY EAR;  Surgeon: Vernie Murders, MD;  Location: [facility] ORS;  Service: ENT;  Laterality: Right;  . KELOID EXCISION    . MINOR EXCISION EAR CANAL MASS Right 05/10/2015   Procedure: MINOR EXCISION EAR CANAL MASS/EXCISION KELOID AND ADVANCEMENT FLAP REPAIR;  Surgeon: Vernie Murders, MD;  Location: [facility] ORS;  Service: ENT;  Laterality: Right;    Prior to Admission medications   Medication Sig Start Date End Date Taking? Authorizing Provider  albuterol (PROVENTIL HFA;VENTOLIN HFA) 108 (90 Base) MCG/ACT inhaler Inhale 2 puffs into the lungs every 6 (six) hours as needed for wheezing or shortness of breath. 04/07/18   Sharyn Creamer, MD  alum & mag hydroxide-simeth (MAALOX MAX) 400-400-40 MG/5ML suspension Take 5 mLs by mouth every 6 (six) hours as needed for indigestion. 07/14/18   Nita Sickle, MD  DULoxetine (CYMBALTA) 30 MG capsule Take 30 mg by mouth daily.  12/13/17 12/13/18  [provider]  Erenumab-aooe (AIMOVIG) 70 MG/ML SOAJ Inject 70 mg into the skin every 28 (twenty-eight) days.    [provider]  Fluticasone-Salmeterol (ADVAIR DISKUS) 250-50 MCG/DOSE AEPB Inhale 1 puff into the lungs daily.     [provider]  gabapentin (NEURONTIN) 300 MG capsule Take 1 capsule (300 mg total) by mouth 3 (three) times daily for 14 days.  09/15/18 09/29/18  Nance Pear, MD  hydrochlorothiazide (HYDRODIURIL) 25 MG tablet Take 25 mg by mouth daily.  12/10/17 12/10/18  [provider]  metFORMIN (GLUCOPHAGE) 500 MG tablet Take 500 mg by mouth daily.  11/07/17   [provider]  montelukast (SINGULAIR) 10 MG tablet Take 10 mg by mouth at bedtime.    [provider]   Multiple Vitamin (MULTI-VITAMINS) TABS Take 1 tablet by mouth daily.     [provider]  omeprazole (PRILOSEC) 40 MG capsule Take 1 capsule (40 mg total) by mouth 2 (two) times daily before a meal. 12/31/17 04/07/18  Vanga, Tally Due, MD  predniSONE (DELTASONE) 20 MG tablet Take 2 tablets (40 mg total) by mouth daily with breakfast. 04/07/18   Delman Kitten, MD  predniSONE (STERAPRED UNI-PAK 21 TAB) 10 MG (21) TBPK tablet Per packaging instructions 09/15/18   Nance Pear, MD    Allergies Atorvastatin, Peanut oil, Shellfish allergy, Simvastatin, Cat hair extract, Pollen extract, and Penicillins  History reviewed. No pertinent family history.  Social History Social History   Tobacco Use  . Smoking status: Former Smoker    Packs/day: 0.20    Types: Cigarettes  . Smokeless tobacco: Never Used  Substance Use Topics  . Alcohol use: Yes    Comment: occasionally  . Drug use: No    Review of Systems  Constitutional: No fever. Eyes: No redness. ENT: No sore throat. Cardiovascular: Denies chest pain. Respiratory: Denies shortness of breath. Gastrointestinal: Positive for nausea and vomiting. Genitourinary: Negative for dysuria.  Musculoskeletal: Negative for back pain. Skin: Negative for rash. Neurological: Negative for headache.   ____________________________________________   PHYSICAL EXAM:  VITAL SIGNS: ED Triage Vitals  Enc Vitals Group     BP 05/23/19 1819 134/69     Pulse Rate 05/23/19 1819 (!) 102     Resp 05/23/19 1819 16     Temp 05/23/19 1819 98.5 F (36.9 C)     Temp Source 05/23/19 1819 Oral     SpO2 05/23/19 1819 97 %     Weight 05/23/19 1825 (!) 369 lb (167.4 kg)     Height 05/23/19 1825 5\' 5"  (1.651 m)     Head Circumference --      Peak Flow --      Pain Score 05/23/19 1825 7     Pain Loc --      Pain Edu? --      Excl. in [place]? --     Constitutional: Alert and oriented.  Relatively well appearing and in no acute distress. Eyes:  Conjunctivae are normal.  No scleral icterus. Head: Atraumatic. Nose: No congestion/rhinnorhea. Mouth/Throat: Mucous membranes are moist.   Neck: Normal range of motion.  Cardiovascular: Good peripheral circulation. Respiratory: Normal respiratory effort.  No retractions.  Gastrointestinal: Soft with moderate bilateral upper quadrant tenderness.  No distention.  Genitourinary: No flank tenderness. Musculoskeletal: No lower extremity edema.  Extremities warm and well perfused.  Neurologic:  Normal speech and language. No gross focal neurologic deficits are appreciated.  Skin:  Skin is warm and dry. No rash noted.  Left proximal inner thigh with approximately 3 cm area of induration, warmth, and slight firmness.  There is no significant fluctuance or drainable mass. Psychiatric: Mood and affect are normal. Speech and behavior are normal.  ____________________________________________   LABS (all labs ordered are listed, but only abnormal results are displayed)  Labs Reviewed  CBC - Abnormal; Notable for the following components:      Result Value  WBC 11.6 (*)    Hemoglobin 11.3 (*)    HCT 35.9 (*)    MCV 78.0 (*)    MCH 24.6 (*)    Platelets 442 (*)    All other components within normal limits  URINALYSIS, COMPLETE (UACMP) WITH MICROSCOPIC - Abnormal; Notable for the following components:   Color, Urine YELLOW (*)    APPearance CLEAR (*)    All other components within normal limits  LIPASE, BLOOD  COMPREHENSIVE METABOLIC PANEL  PREGNANCY, URINE  POC URINE PREG, ED   ____________________________________________  EKG   ____________________________________________  RADIOLOGY  CT abdomen: Pending  ____________________________________________   PROCEDURES  Procedure(s) performed: No  Procedures  Critical Care performed: No ____________________________________________   INITIAL IMPRESSION / ASSESSMENT AND PLAN / ED COURSE  Pertinent labs & imaging results that  were available during my care of the patient were reviewed by me and considered in my medical decision making (see chart for details).  23 year old female with PMH as noted above including a history of a hiatal hernia presents with bilateral upper abdominal pain, more acute on the left, and associated with nausea and vomiting.  I reviewed the past medical records in Epic.  The patient had an upper endoscopy in 2019 confirming a hiatal hernia.  She has been seen several times in the last year in the ED, but primarily for unrelated complaints.  On exam, the patient is overall well-appearing.  Her vital signs are normal except for borderline heart rate.  She is afebrile.  The abdomen is soft, but she does have moderate tenderness in bilateral upper quadrants.  The patient also asked me to examine an area on her proximal left thigh which she believes is an infection.  It does appear to be a small area of cellulitis, with a possible early abscess but not 1 amenable to I&D at this time.  We will plan for treatment with antibiotics and warm compresses.  In terms of the abdominal pain, given the location I suspect most likely gastritis, PUD (which the patient has a history of) or less likely hepatobiliary etiology.  Differential also includes gastroparesis, or possibly acute gastroenteritis or colitis.  Given the tenderness we will obtain a CT as well as labs and reassess.  If the CT shows no concerning acute findings and the patient is tolerating p.o., I anticipate discharge home.  ----------------------------------------- 11:17 PM on 05/23/2019 -----------------------------------------  CT abdomen is pending.  I am signing the patient out to the oncoming physician Dr. Lenard LancePaduchowski.   ____________________________________________   FINAL CLINICAL IMPRESSION(S) / ED DIAGNOSES  Final diagnoses:  Pain of upper abdomen      NEW MEDICATIONS STARTED DURING THIS VISIT:  New Prescriptions   No  medications on file     Note:  This document was prepared using Dragon voice recognition software and may include unintentional dictation errors.    Dionne BucySiadecki, Inell Mimbs, MD 05/23/19 2317

## 2019-05-24 MED ORDER — ONDANSETRON 4 MG PO TBDP: 4.0000 mg | ORAL_TABLET | Freq: Three times a day (TID) | ORAL | 0 refills | Status: AC | PRN

## 2019-05-24 NOTE — ED Provider Notes (Signed)
-----------------------------------------   12:17 AM on 05/24/2019 -----------------------------------------  Patient care assumed from Dr. Eula Listen.  Patient CT is resulted showing no intra-abdominal pathology.  Lab work largely reassuring.  There is a small area of possible cellulitis as seen on CT and clinically by the prior provider.  We will discharge with antibiotics and PCP follow-up.   Harvest Dark, MD 05/24/19 [phone]

## 2019-09-04 IMAGING — CR DG CHEST 2V
1 series · 2 of 2 positions shown · non-contrast
Comparison: July 09, 2017

CLINICAL DATA: Chest pain.

EXAM:
CHEST - 2 VIEW

[Series 1: dg chest 2 view · 0.14mm/px · 2 of 2 slices shown]
[im 1/2]
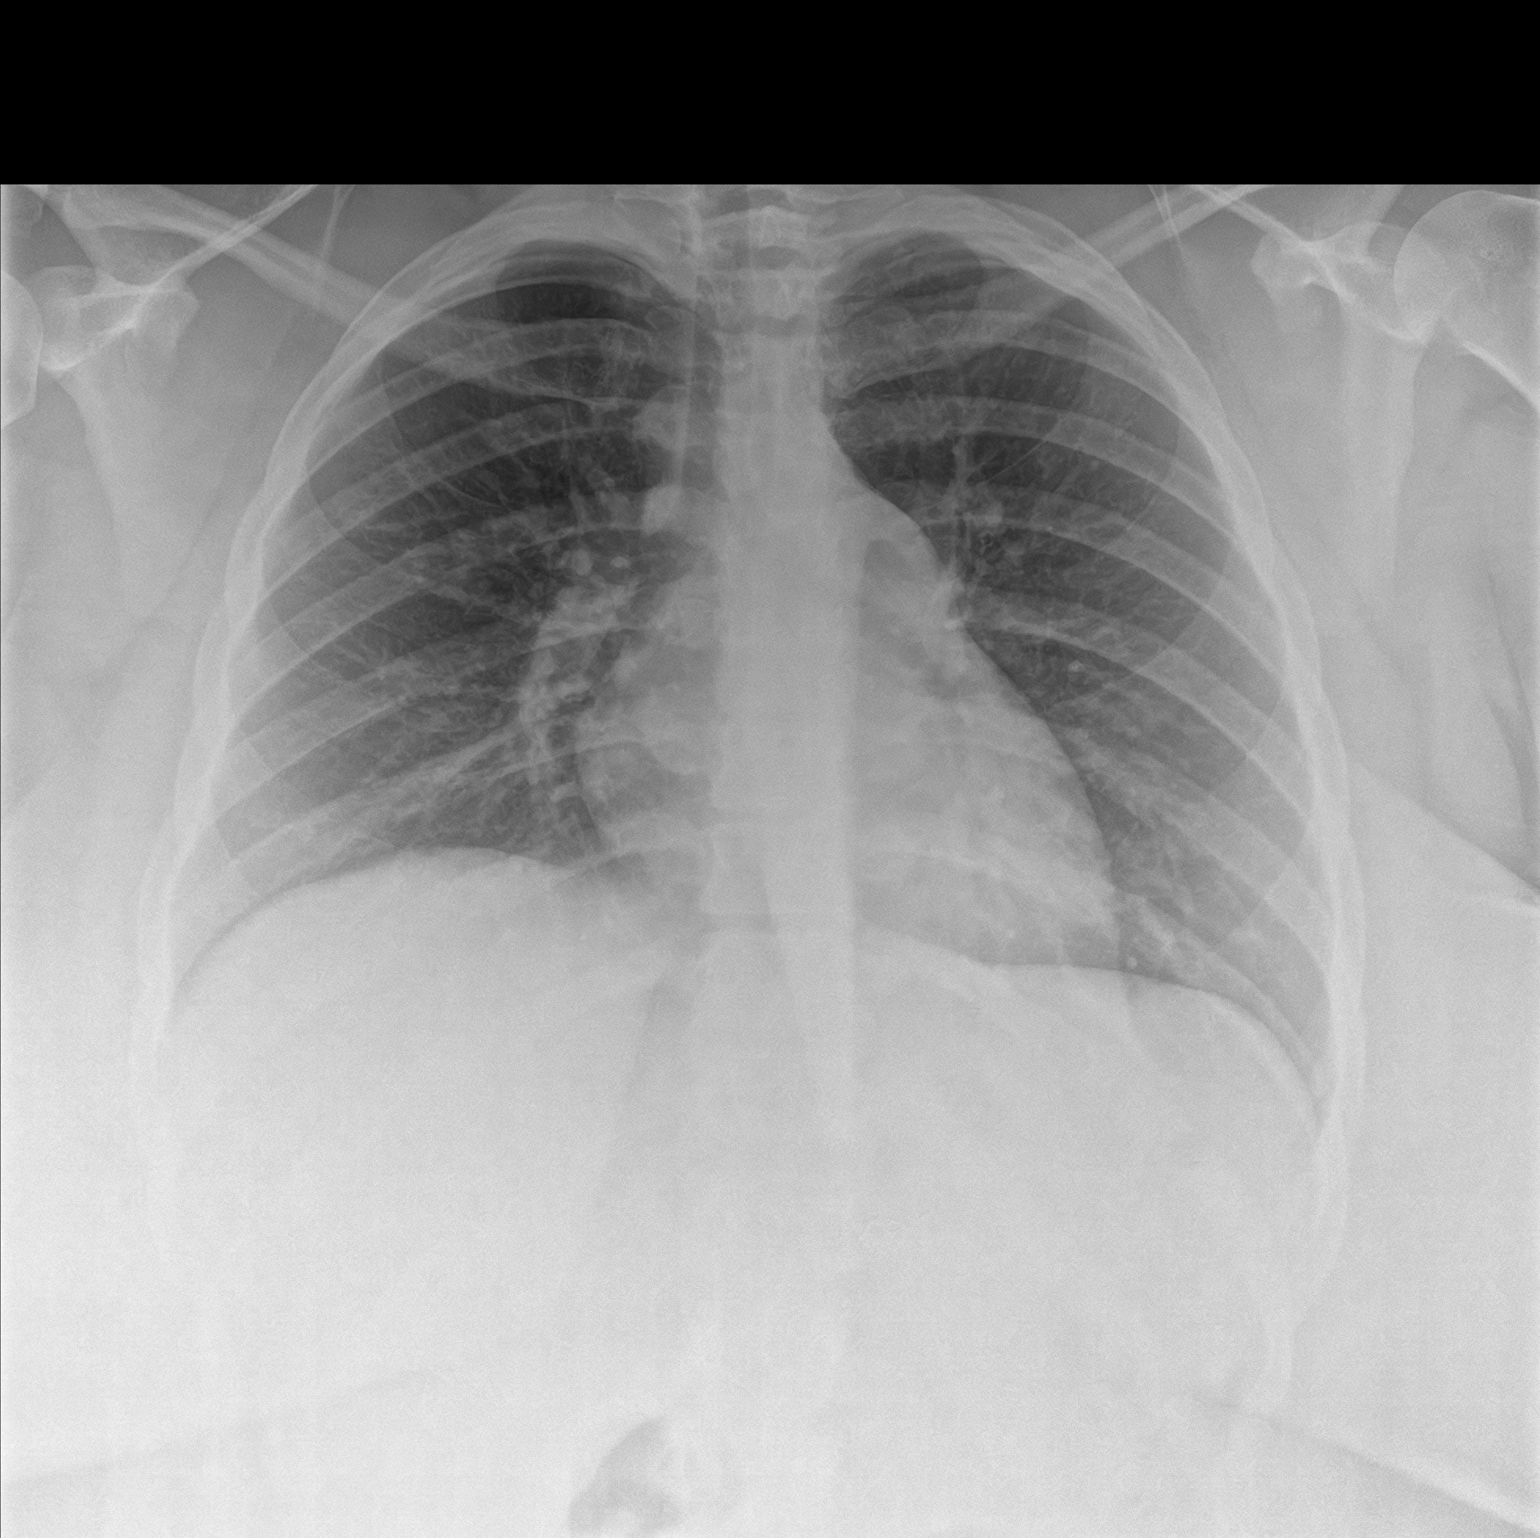
[im 2/2]
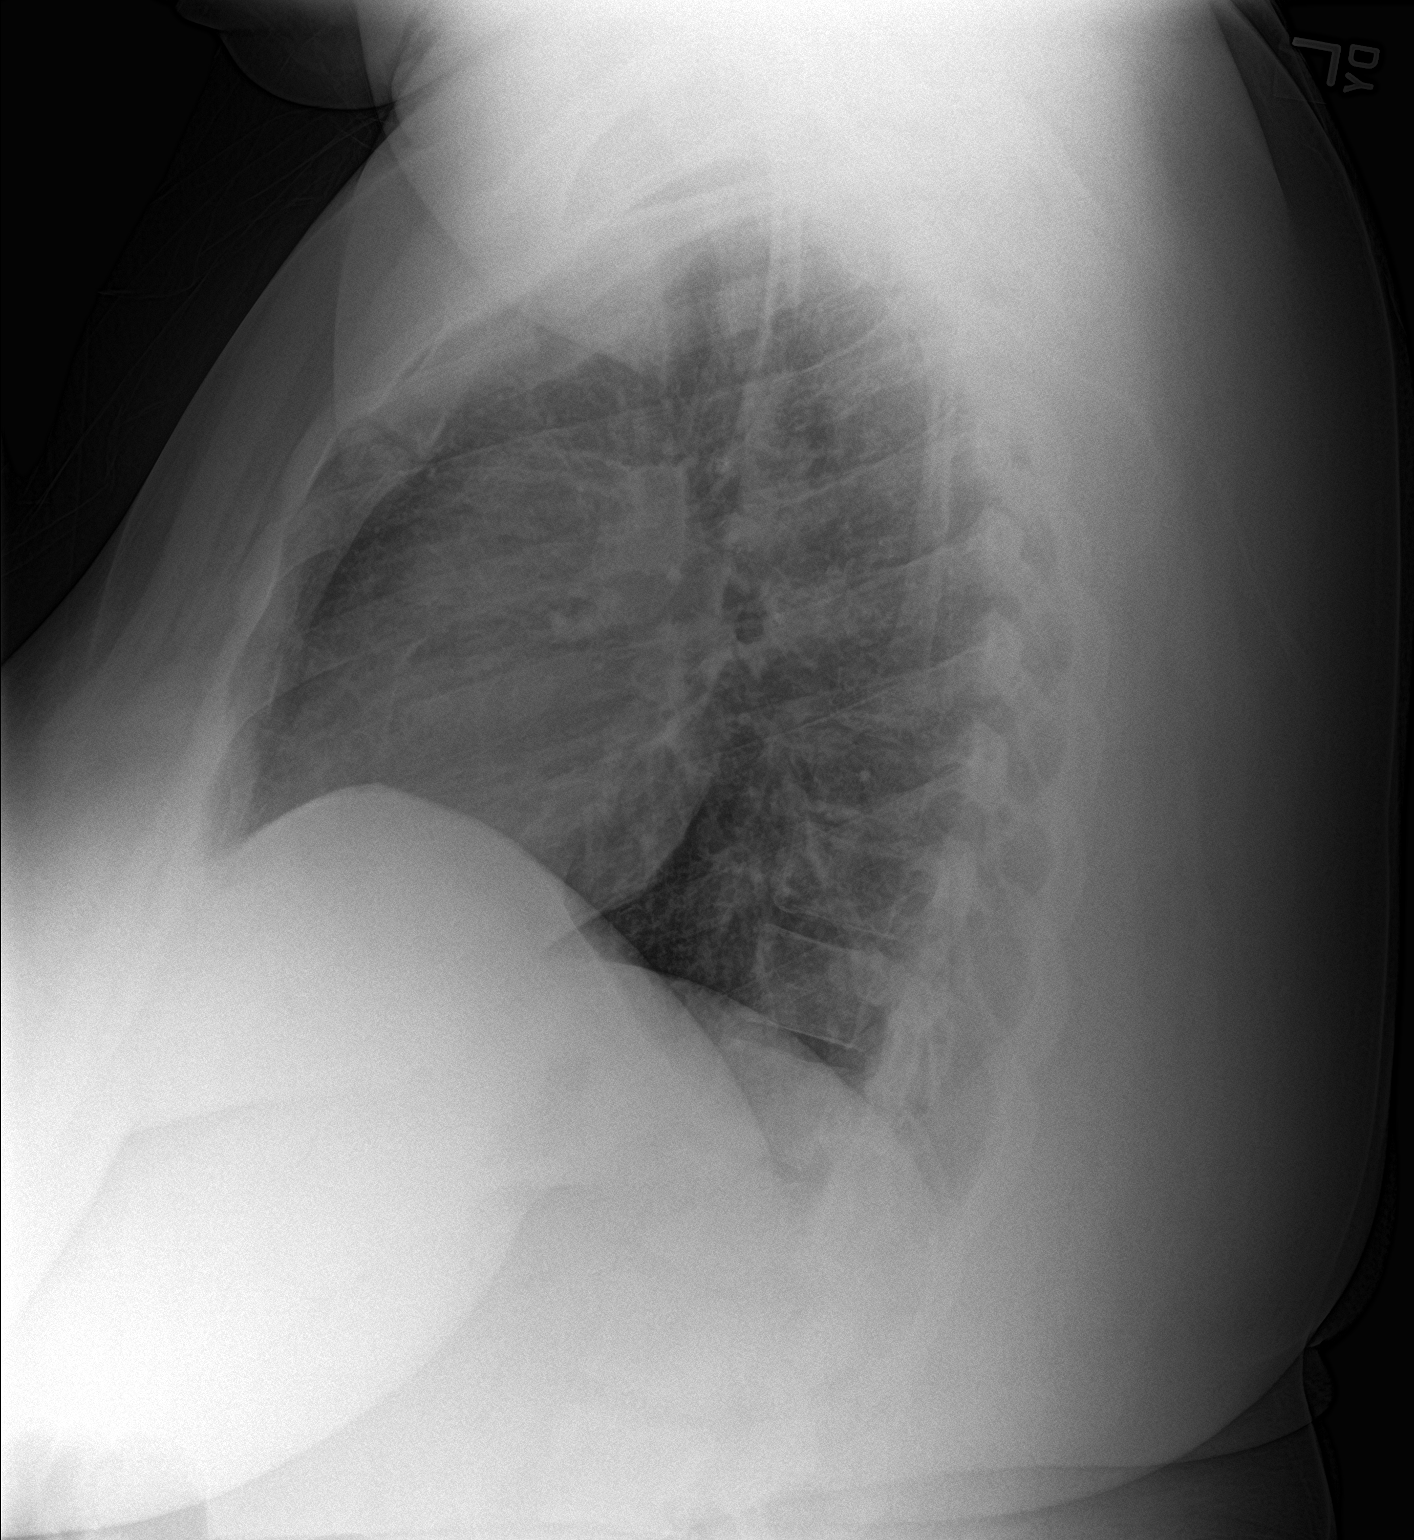

[2 of 2 positions shown; findings below may reference images not displayed]

FINDINGS: The heart size and mediastinal contours are within normal limits.
Both lungs are clear. The visualized skeletal structures are
unremarkable.
IMPRESSION: No active cardiopulmonary disease.

## 2019-11-08 ENCOUNTER — Emergency Department: Payer: Self-pay

## 2019-11-08 ENCOUNTER — Other Ambulatory Visit: Payer: Self-pay

## 2019-11-08 ENCOUNTER — Encounter: Payer: Self-pay | Admitting: Emergency Medicine

## 2019-11-08 ENCOUNTER — Emergency Department
Admission: EM | Admit: 2019-11-08 | Discharge: 2019-11-08 | Disposition: A | Discharge status: Home / Self Care | Payer: Self-pay | Attending: Emergency Medicine | Admitting: Emergency Medicine

## 2019-11-08 DIAGNOSIS — Z87891 Personal history of nicotine dependence: Secondary | ICD-10-CM | POA: Insufficient documentation

## 2019-11-08 DIAGNOSIS — Y999 Unspecified external cause status: Secondary | ICD-10-CM | POA: Insufficient documentation

## 2019-11-08 DIAGNOSIS — Z91010 Allergy to peanuts: Secondary | ICD-10-CM | POA: Insufficient documentation

## 2019-11-08 DIAGNOSIS — Z79899 Other long term (current) drug therapy: Secondary | ICD-10-CM | POA: Insufficient documentation

## 2019-11-08 DIAGNOSIS — J45909 Unspecified asthma, uncomplicated: Secondary | ICD-10-CM | POA: Insufficient documentation

## 2019-11-08 DIAGNOSIS — Y939 Activity, unspecified: Secondary | ICD-10-CM | POA: Insufficient documentation

## 2019-11-08 DIAGNOSIS — S93402A Sprain of unspecified ligament of left ankle, initial encounter: Secondary | ICD-10-CM | POA: Insufficient documentation

## 2019-11-08 DIAGNOSIS — W010XXA Fall on same level from slipping, tripping and stumbling without subsequent striking against object, initial encounter: Secondary | ICD-10-CM | POA: Insufficient documentation

## 2019-11-08 DIAGNOSIS — Z7984 Long term (current) use of oral hypoglycemic drugs: Secondary | ICD-10-CM | POA: Insufficient documentation

## 2019-11-08 DIAGNOSIS — I10 Essential (primary) hypertension: Secondary | ICD-10-CM | POA: Insufficient documentation

## 2019-11-08 DIAGNOSIS — Y929 Unspecified place or not applicable: Secondary | ICD-10-CM | POA: Insufficient documentation

## 2019-11-08 MED ORDER — OXYCODONE-ACETAMINOPHEN 7.5-325 MG PO TABS: 1.0000 | ORAL_TABLET | Freq: Four times a day (QID) | ORAL | 0 refills | Status: AC | PRN

## 2019-11-08 NOTE — Discharge Instructions (Signed)
Follow discharge care instructions and wear splint as directed.  Use crutches to assist with ambulation.  Be advised pain medication may cause drowsiness.

## 2019-11-08 NOTE — ED Triage Notes (Signed)
Pt presents to ED via POV and reports fell yesterday morning at approx 0800, pt presents with wrapping in place. Pt states was dx with sprained L foot yesterday. Pt states had X-rays done yesterday and was told her foot was not broken. Pt A&Ox 4, NAD noted in triage.

## 2019-11-08 NOTE — ED Provider Notes (Signed)
[facility] Emergency Department Provider Note   ____________________________________________   First MD Initiated Contact with Patient 11/08/19 1453     (approximate)  I have reviewed the triage vital signs and the nursing notes.   HISTORY  Chief Complaint Fall and Ankle Pain    HPI Joann Miller is a 24 y.o. female patient presents with left ankle pain secondary to fall yesterday.  Patient she was seen at the ED in [place].  Patient states no fracture and was placed in an OCL splint.  Patient states splint is causing increased edema to her foot.  Patient also state pain is not relieved with ibuprofen.  Patient do not believe the ankle is not broken.  Requesting reevaluation with imaging.  Patient rates her pain as a 10/10.  Patient scribed pain is "achy".         Past Medical History:  Diagnosis Date  . Asthma   . Hypertension   . Migraine   . Migraines   . Murmur   . Obesity   . Sciatic leg pain   . Syncope   . Tachycardia     Patient Active Problem List   Diagnosis Date Noted  . 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 Essential hypertension 11/07/2017  . Hypercholesterolemia with LDL greater than 190 mg/dL 50/93/2671  . Chronic insomnia 04/07/2015  . Morbid obesity due to excess calories (HCC) 10/29/2014  . Acute chest pain 08/24/2014  . 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 Intractable migraine without aura and without status migrainosus 08/24/2014  . Syncope and collapse 08/24/2014  . Seizure-like activity (HCC) 04/20/2014  . Contraception management 03/25/2014  . Depression 10/27/2013  . Suicide attempt (HCC) 08/07/2013  . Insulin resistance 06/03/2013  . Amenorrhea 02/06/2013  . Constipation 11/22/2012  . Seasonal allergies 11/22/2012  . Hidradenitis suppurativa 12/27/2011    Past Surgical History:  Procedure Laterality Date  . ESOPHAGOGASTRODUODENOSCOPY (EGD) WITH PROPOFOL N/A  11/27/2017   Procedure: ESOPHAGOGASTRODUODENOSCOPY (EGD) WITH PROPOFOL;  Surgeon: Toney Reil, MD;  Location: [facility] ENDOSCOPY;  Service: Gastroenterology;  Laterality: N/A;  . FOREIGN BODY REMOVAL EAR Right 05/10/2015   Procedure: REMOVAL FOREIGN BODY EAR;  Surgeon: Vernie Murders, MD;  Location: [facility] ORS;  Service: ENT;  Laterality: Right;  . KELOID EXCISION    . MINOR EXCISION EAR CANAL MASS Right 05/10/2015   Procedure: MINOR EXCISION EAR CANAL MASS/EXCISION KELOID AND ADVANCEMENT FLAP REPAIR;  Surgeon: Vernie Murders, MD;  Location: [facility] ORS;  Service: ENT;  Laterality: Right;    Prior to Admission medications   Medication Sig Start Date End Date Taking? Authorizing Provider  albuterol (PROVENTIL HFA;VENTOLIN HFA) 108 (90 Base) MCG/ACT inhaler Inhale 2 puffs into the lungs every 6 (six) hours as needed for wheezing or shortness of breath. 04/07/18   Sharyn Creamer, MD  alum & mag hydroxide-simeth (MAALOX MAX) 400-400-40 MG/5ML suspension Take 5 mLs by mouth every 6 (six) hours as needed for indigestion. 07/14/18   Nita Sickle, MD  DULoxetine (CYMBALTA) 30 MG capsule Take 30 mg by mouth daily.  12/13/17 12/13/18  [provider]  Erenumab-aooe (AIMOVIG) 70 MG/ML SOAJ Inject 70 mg into the skin every 28 (twenty-eight) days.    [provider]  Fluticasone-Salmeterol (ADVAIR DISKUS) 250-50 MCG/DOSE AEPB Inhale 1 puff into the lungs daily.     [provider]  gabapentin (NEURONTIN) 300 MG capsule Take 1 capsule (300 mg total) by mouth 3 (three) times daily for 14 days.  09/15/18 09/29/18  Nance Pear, MD  hydrochlorothiazide (HYDRODIURIL) 25 MG tablet Take 25 mg by mouth daily.  12/10/17 12/10/18  [provider]  metFORMIN (GLUCOPHAGE) 500 MG tablet Take 500 mg by mouth daily.  11/07/17   [provider]  montelukast (SINGULAIR) 10 MG tablet Take 10 mg by mouth at bedtime.    [provider]  Multiple Vitamin (MULTI-VITAMINS) TABS Take 1  tablet by mouth daily.     [provider]  omeprazole (PRILOSEC) 40 MG capsule Take 1 capsule (40 mg total) by mouth 2 (two) times daily before a meal. 12/31/17 04/07/18  Vanga, Tally Due, MD  ondansetron (ZOFRAN ODT) 4 MG disintegrating tablet Take 1 tablet (4 mg total) by mouth every 8 (eight) hours as needed for nausea or vomiting. 05/24/19   Harvest Dark, MD  oxyCODONE-acetaminophen (PERCOCET) 7.5-325 MG tablet Take 1 tablet by mouth every 6 (six) hours as needed. 11/08/19   Sable Feil, PA-C  predniSONE (DELTASONE) 20 MG tablet Take 2 tablets (40 mg total) by mouth daily with breakfast. 04/07/18   Delman Kitten, MD  predniSONE (STERAPRED UNI-PAK 21 TAB) 10 MG (21) TBPK tablet Per packaging instructions 09/15/18   Nance Pear, MD    Allergies Atorvastatin, Peanut oil, Shellfish allergy, Simvastatin, Cat hair extract, Pollen extract, and Penicillins  History reviewed. No pertinent family history.  Social History Social History   Tobacco Use  . Smoking status: Former Smoker    Packs/day: 0.20    Types: Cigarettes  . Smokeless tobacco: Never Used  Substance Use Topics  . Alcohol use: Yes    Comment: occasionally  . Drug use: No    Review of Systems  Constitutional: No fever/chills Eyes: No visual changes. ENT: No sore throat. Cardiovascular: Denies chest pain. Respiratory: Denies shortness of breath. Gastrointestinal: No abdominal pain.  No nausea, no vomiting.  No diarrhea.  No constipation. Genitourinary: Negative for dysuria. Musculoskeletal: Left ankle pain.   Skin: Negative for rash. Neurological: Negative for headaches, focal weakness or numbness. Endocrine:  Hyperlipidemia and hypertension Allergic/Immunilogical: Atorvastatin, peanut oil, shellfish, simvastatin, cat hair, pollen, and penicillin.  ____________________________________________   PHYSICAL EXAM:  VITAL SIGNS: ED Triage Vitals  Enc Vitals Group     BP 11/08/19 1449 (!) 161/86      Pulse Rate 11/08/19 1449 (!) 120     Resp 11/08/19 1449 20     Temp 11/08/19 1449 98.8 F (37.1 C)     Temp Source 11/08/19 1449 Oral     SpO2 11/08/19 1449 100 %     Weight 11/08/19 1447 (!) 360 lb (163.3 kg)     Height 11/08/19 1447 5\' 5"  (1.651 m)     Head Circumference --      Peak Flow --      Pain Score 11/08/19 1447 10     Pain Loc --      Pain Edu? --      Excl. in [place]? --    Constitutional: Alert and oriented. Well appearing and in no acute distress.  Morbid obesity. Cardiovascular: Normal rate, regular rhythm. Grossly normal heart sounds.  Good peripheral circulation.  Elevated blood pressure. Respiratory: Normal respiratory effort.  No retractions. Lungs CTAB. Gastrointestinal: Soft and nontender. No distention. No abdominal bruits. No CVA tenderness. Genitourinary: Deferred Musculoskeletal: No obvious deformity to the left ankle.  Patient is moderate guarding palpation medial and lateral aspect of the ankle.  Moderate edema.   Neurologic:  Normal speech and language. No gross focal  neurologic deficits are appreciated. No gait instability. Skin:  Skin is warm, dry and intact. No rash noted. Psychiatric: Mood and affect are normal. Speech and behavior are normal.  ____________________________________________   LABS (all labs ordered are listed, but only abnormal results are displayed)  Labs Reviewed - No data to display ____________________________________________  EKG   ____________________________________________  RADIOLOGY  ED MD interpretation:    Official radiology report(s): DG Ankle Complete Left  Result Date: 11/08/2019 CLINICAL DATA:  Pain and edema after fall yesterday. EXAM: LEFT ANKLE COMPLETE - 3+ VIEW COMPARISON:  None. FINDINGS: Soft tissue swelling.  No fractures or dislocations are identified. IMPRESSION: Soft tissue swelling without visualized fracture. Electronically Signed   By: Gerome Sam III M.D   On: 11/08/2019 16:00     ____________________________________________   PROCEDURES  Procedure(s) performed (including Critical Care):  Procedures   ____________________________________________   INITIAL IMPRESSION / ASSESSMENT AND PLAN / ED COURSE  As part of my medical decision making, I reviewed the following data within the electronic MEDICAL RECORD NUMBER     Patient presents with left ankle pain secondary to fall which occurred yesterday.  Discussed x-ray findings with patient which is negative for bony abnormalities.  Patient placed in a posterior OCL and advised continue using crutches to assist ambulation.  Patient advised continue ibuprofen but was given a prescription for Percocets.  Patient given work note and will follow up as scheduled orthopedic appointment next week.    Joann Miller was evaluated in Emergency Department on 11/08/2019 for the symptoms described in the history of present illness. She was evaluated in the context of the global COVID-19 pandemic, which necessitated consideration that the patient might be at risk for infection with the SARS-CoV-2 virus that causes COVID-19. Institutional protocols and algorithms that pertain to the evaluation of patients at risk for COVID-19 are in a state of rapid change based on information released by regulatory bodies including the CDC and federal and state organizations. These policies and algorithms were followed during the patient's care in the ED.       ____________________________________________   FINAL CLINICAL IMPRESSION(S) / ED DIAGNOSES  Final diagnoses:  Sprain of left ankle, unspecified ligament, initial encounter     ED Discharge Orders         Ordered    oxyCODONE-acetaminophen (PERCOCET) 7.5-325 MG tablet  Every 6 hours PRN     11/08/19 1610           Note:  This document was prepared using Dragon voice recognition software and may include unintentional dictation errors.    Joni Reining, PA-C 11/08/19 1620     Sharyn Creamer, MD 11/10/19 2137

## 2021-01-03 DIAGNOSIS — E7801 Familial hypercholesterolemia: Secondary | ICD-10-CM | POA: Insufficient documentation

## 2021-01-03 DIAGNOSIS — K2100 Gastro-esophageal reflux disease with esophagitis, without bleeding: Secondary | ICD-10-CM | POA: Insufficient documentation

## 2022-05-03 DIAGNOSIS — Z880 Allergy status to penicillin: Secondary | ICD-10-CM | POA: Insufficient documentation

## 2022-05-03 DIAGNOSIS — O99213 Obesity complicating pregnancy, third trimester: Secondary | ICD-10-CM | POA: Insufficient documentation

## 2022-05-03 DIAGNOSIS — O99810 Abnormal glucose complicating pregnancy: Secondary | ICD-10-CM | POA: Insufficient documentation

## 2022-05-03 DIAGNOSIS — E559 Vitamin D deficiency, unspecified: Secondary | ICD-10-CM | POA: Insufficient documentation

## 2022-05-03 DIAGNOSIS — Z87898 Personal history of other specified conditions: Secondary | ICD-10-CM | POA: Insufficient documentation

## 2022-05-03 DIAGNOSIS — O0993 Supervision of high risk pregnancy, unspecified, third trimester: Secondary | ICD-10-CM | POA: Insufficient documentation

## 2022-06-04 ENCOUNTER — Encounter: Payer: Self-pay | Admitting: Obstetrics and Gynecology

## 2022-06-04 ENCOUNTER — Other Ambulatory Visit: Payer: Self-pay

## 2022-06-04 ENCOUNTER — Observation Stay
Admission: EM | Admit: 2022-06-04 | Discharge: 2022-06-04 | Disposition: A | Discharge status: Home / Self Care | Payer: Medicaid Other | Attending: Obstetrics and Gynecology | Admitting: Obstetrics and Gynecology

## 2022-06-04 ENCOUNTER — Observation Stay: Payer: Medicaid Other

## 2022-06-04 DIAGNOSIS — O26893 Other specified pregnancy related conditions, third trimester: Principal | ICD-10-CM | POA: Insufficient documentation

## 2022-06-04 DIAGNOSIS — Z87891 Personal history of nicotine dependence: Secondary | ICD-10-CM | POA: Diagnosis not present

## 2022-06-04 DIAGNOSIS — O99513 Diseases of the respiratory system complicating pregnancy, third trimester: Secondary | ICD-10-CM | POA: Insufficient documentation

## 2022-06-04 DIAGNOSIS — O10913 Unspecified pre-existing hypertension complicating pregnancy, third trimester: Secondary | ICD-10-CM | POA: Insufficient documentation

## 2022-06-04 DIAGNOSIS — R1030 Lower abdominal pain, unspecified: Secondary | ICD-10-CM | POA: Diagnosis not present

## 2022-06-04 DIAGNOSIS — Z3A34 34 weeks gestation of pregnancy: Secondary | ICD-10-CM | POA: Diagnosis not present

## 2022-06-04 DIAGNOSIS — Z7982 Long term (current) use of aspirin: Secondary | ICD-10-CM | POA: Insufficient documentation

## 2022-06-04 DIAGNOSIS — Z7984 Long term (current) use of oral hypoglycemic drugs: Secondary | ICD-10-CM | POA: Diagnosis not present

## 2022-06-04 DIAGNOSIS — Z7951 Long term (current) use of inhaled steroids: Secondary | ICD-10-CM | POA: Insufficient documentation

## 2022-06-04 DIAGNOSIS — O26899 Other specified pregnancy related conditions, unspecified trimester: Secondary | ICD-10-CM | POA: Diagnosis present

## 2022-06-04 DIAGNOSIS — J45909 Unspecified asthma, uncomplicated: Secondary | ICD-10-CM | POA: Insufficient documentation

## 2022-06-04 DIAGNOSIS — Y92410 Unspecified street and highway as the place of occurrence of the external cause: Secondary | ICD-10-CM | POA: Diagnosis not present

## 2022-06-04 MED ORDER — CALCIUM CARBONATE ANTACID 500 MG PO CHEW: 2.0000 | CHEWABLE_TABLET | ORAL | Status: DC | PRN

## 2022-06-04 MED ORDER — ACETAMINOPHEN 500 MG PO TABS
1000.0000 mg | ORAL_TABLET | Freq: Four times a day (QID) | ORAL | Status: DC | PRN
  Administered 2022-06-04: 1000 mg via ORAL
  Filled 2022-06-04: qty 2

## 2022-06-04 NOTE — OB Triage Note (Signed)
Discharged home. Questions answered. Left floor via wheelchair by staff. Follow up appointment already scheduled for Friday, per patient. Dineen Kid
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
## 2022-06-04 NOTE — ED Triage Notes (Signed)
Report given to Jessica RN
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
## 2023-02-02 LAB — PANORAMA PRENATAL TEST FULL PANEL:PANORAMA TEST PLUS 5 ADDITIONAL MICRODELETIONS: FETAL FRACTION: 7.2
